# Patient Record
Sex: Female | Born: 1960 | Hispanic: Refuse to answer | Smoking: Former smoker
Health system: Southern US, Community
[De-identification: ages and names within clinical notes are randomized; demographics above are authoritative.]

## PROBLEM LIST (undated history)

## (undated) DIAGNOSIS — E785 Hyperlipidemia, unspecified: Secondary | ICD-10-CM

## (undated) DIAGNOSIS — Z8639 Personal history of other endocrine, nutritional and metabolic disease: Secondary | ICD-10-CM

## (undated) DIAGNOSIS — M069 Rheumatoid arthritis, unspecified: Secondary | ICD-10-CM

## (undated) DIAGNOSIS — G610 Guillain-Barre syndrome: Secondary | ICD-10-CM

## (undated) HISTORY — PX: TOTAL HIP ARTHROPLASTY: SHX124

## (undated) HISTORY — PX: TUBAL LIGATION: SHX77

## (undated) HISTORY — PX: APPENDECTOMY: SHX54

## (undated) HISTORY — DX: Personal history of other endocrine, nutritional and metabolic disease: Z86.39

## (undated) HISTORY — DX: Rheumatoid arthritis, unspecified: M06.9

## (undated) HISTORY — DX: Guillain-Barre syndrome: G61.0

## (undated) HISTORY — DX: Hyperlipidemia, unspecified: E78.5

---

## 2018-09-24 HISTORY — PX: REPLACEMENT TOTAL KNEE: SUR1224

## 2020-12-30 DIAGNOSIS — J449 Chronic obstructive pulmonary disease, unspecified: Secondary | ICD-10-CM | POA: Diagnosis not present

## 2020-12-30 DIAGNOSIS — F17210 Nicotine dependence, cigarettes, uncomplicated: Secondary | ICD-10-CM | POA: Diagnosis not present

## 2020-12-30 DIAGNOSIS — M069 Rheumatoid arthritis, unspecified: Secondary | ICD-10-CM | POA: Diagnosis not present

## 2020-12-30 DIAGNOSIS — E663 Overweight: Secondary | ICD-10-CM | POA: Diagnosis not present

## 2020-12-30 DIAGNOSIS — E0500 Thyrotoxicosis with diffuse goiter without thyrotoxic crisis or storm: Secondary | ICD-10-CM | POA: Diagnosis not present

## 2020-12-30 DIAGNOSIS — J309 Allergic rhinitis, unspecified: Secondary | ICD-10-CM | POA: Diagnosis not present

## 2020-12-30 DIAGNOSIS — M810 Age-related osteoporosis without current pathological fracture: Secondary | ICD-10-CM | POA: Diagnosis not present

## 2020-12-30 DIAGNOSIS — R229 Localized swelling, mass and lump, unspecified: Secondary | ICD-10-CM | POA: Diagnosis not present

## 2020-12-30 DIAGNOSIS — M159 Polyosteoarthritis, unspecified: Secondary | ICD-10-CM | POA: Diagnosis not present

## 2021-01-06 DIAGNOSIS — Z1159 Encounter for screening for other viral diseases: Secondary | ICD-10-CM | POA: Diagnosis not present

## 2021-01-06 DIAGNOSIS — Z79899 Other long term (current) drug therapy: Secondary | ICD-10-CM | POA: Diagnosis not present

## 2021-01-13 DIAGNOSIS — J309 Allergic rhinitis, unspecified: Secondary | ICD-10-CM | POA: Diagnosis not present

## 2021-01-13 DIAGNOSIS — M159 Polyosteoarthritis, unspecified: Secondary | ICD-10-CM | POA: Diagnosis not present

## 2021-01-13 DIAGNOSIS — M069 Rheumatoid arthritis, unspecified: Secondary | ICD-10-CM | POA: Diagnosis not present

## 2021-01-13 DIAGNOSIS — Z008 Encounter for other general examination: Secondary | ICD-10-CM | POA: Diagnosis not present

## 2021-01-13 DIAGNOSIS — E0500 Thyrotoxicosis with diffuse goiter without thyrotoxic crisis or storm: Secondary | ICD-10-CM | POA: Diagnosis not present

## 2021-01-13 DIAGNOSIS — E663 Overweight: Secondary | ICD-10-CM | POA: Diagnosis not present

## 2021-01-13 DIAGNOSIS — F17210 Nicotine dependence, cigarettes, uncomplicated: Secondary | ICD-10-CM | POA: Diagnosis not present

## 2021-01-13 DIAGNOSIS — D8481 Immunodeficiency due to conditions classified elsewhere: Secondary | ICD-10-CM | POA: Diagnosis not present

## 2021-01-13 DIAGNOSIS — J449 Chronic obstructive pulmonary disease, unspecified: Secondary | ICD-10-CM | POA: Diagnosis not present

## 2021-01-20 ENCOUNTER — Other Ambulatory Visit: Payer: Self-pay | Admitting: Family Medicine

## 2021-01-20 DIAGNOSIS — Z1231 Encounter for screening mammogram for malignant neoplasm of breast: Secondary | ICD-10-CM

## 2021-02-11 LAB — COLOGUARD: COLOGUARD: POSITIVE — AB

## 2021-02-22 NOTE — Progress Notes (Signed)
Office Visit Note  Patient: Leslie Brewer             Date of Birth: 11/16/1960           MRN: [id number]             PCP: Ellyn Hack, MD Referring: Ellyn Hack, MD Visit Date: 02/23/2021 Occupation: @  Subjective:  Pain in multiple joints.   History of Present Illness: Leslie Brewer is a 60 y.o. female in consultation per request of her PCP.  According to the patient her symptoms a started about 17 years ago after she fell and fractured her left ankle joint.  She states she was in a boot for sometimes and after that she started having pain and swelling in multiple joints.  She was seen by her rheumatologist and her rheumatoid factor was positive.  She was diagnosed with rheumatoid arthritis.  She recalls being on prednisone for a long time and gained a lot of weight.  She gradually tapered off prednisone.  She does not recall all the treatment she has taken in the past but she believes she was on some form of oral medication and some form of injectable medication before she started Enbrel.  She has been on Enbrel off and on for the last 6 years.  She was concerned about the side effects of Enbrel and took it only intermittently.  She states she moved from [place] to [place] in February 2022.  She had an Ro which lasted her until April 2022.  She has been off Enbrel since April 2022.  She has been experiencing pain and discomfort in her shoulders, elbows, wrist, hands, knees.  She has not seen visible swelling.  She has had bilateral total hip replacement and left knee replacement in the past.  There is no family history of rheumatoid arthritis or autoimmune disease.  He is gravida 3, para 1, abortions 2.  Activities of Daily Living:  Patient reports morning stiffness for 2  hours.   Patient Reports nocturnal pain.  Difficulty dressing/grooming: Reports Difficulty climbing stairs: Reports Difficulty getting out of chair: Reports Difficulty using hands for taps,  buttons, cutlery, and/or writing: Reports  Review of Systems  Constitutional: Positive for fatigue. Negative for night sweats, weight gain and weight loss.  HENT: Negative for mouth sores, trouble swallowing, trouble swallowing, mouth dryness and nose dryness.   Eyes: Negative for pain, redness, itching, visual disturbance and dryness.  Respiratory: Positive for shortness of breath. Negative for cough and difficulty breathing.        Related to anxiety and COPD  Cardiovascular: Negative for chest pain, palpitations, hypertension, irregular heartbeat and swelling in legs/feet.  Gastrointestinal: Positive for constipation. Negative for blood in stool and diarrhea.  Endocrine: Negative for increased urination.  Genitourinary: Negative for difficulty urinating and vaginal dryness.  Musculoskeletal: Positive for arthralgias, joint pain, joint swelling and morning stiffness. Negative for myalgias, muscle weakness, muscle tenderness and myalgias.  Skin: Positive for rash. Negative for color change, hair loss, skin tightness, ulcers and sensitivity to sunlight.       H/o eczema  Allergic/Immunologic: Negative for susceptible to infections.  Neurological: Positive for dizziness and numbness. Negative for headaches, memory loss, night sweats and weakness.  Hematological: Negative for bruising/bleeding tendency and swollen glands.  Psychiatric/Behavioral: Positive for depressed mood and sleep disturbance. Negative for confusion. The patient is nervous/anxious.     PMFS History:  Patient Active Problem List   Diagnosis Date  Noted  . Rheumatoid arthritis involving multiple sites with positive rheumatoid factor (HCC) 02/23/2021  . H/O bilateral hip replacements 02/23/2021  . History of total left knee replacement 02/23/2021    History reviewed. No pertinent past medical history.  Family History  Problem Relation Age of Onset  . Cancer Mother   . Healthy Daughter    Past Surgical History:   Procedure Laterality Date  . APPENDECTOMY    . REPLACEMENT TOTAL KNEE Left 2020  . TOTAL HIP ARTHROPLASTY Right   . TOTAL HIP ARTHROPLASTY Left   . TUBAL LIGATION     Social History   Social History Narrative  . Not on file    There is no immunization history on file for this patient.   Objective: Vital Signs: BP 138/83 (BP Location: Right Arm, Patient Position: Sitting, Cuff Size: Normal)   Pulse 89   Ht  (1.702 m)   Wt 164 lb 9.6 oz (74.7 kg)   BMI 25.78 kg/m    Physical Exam Vitals and nursing note reviewed.  Constitutional:      Appearance: She is well-developed.  HENT:     Head: Normocephalic and atraumatic.  Eyes:     Conjunctiva/sclera: Conjunctivae normal.  Cardiovascular:     Rate and Rhythm: Normal rate and regular rhythm.     Heart sounds: Normal heart sounds.  Pulmonary:     Effort: Pulmonary effort is normal.     Breath sounds: Normal breath sounds.  Abdominal:     General: Bowel sounds are normal.     Palpations: Abdomen is soft.  Musculoskeletal:     Cervical back: Normal range of motion.  Lymphadenopathy:     Cervical: No cervical adenopathy.  Skin:    General: Skin is warm and dry.     Capillary Refill: Capillary refill takes less than 2 seconds.  Neurological:     Mental Status: She is alert and oriented to person, place, and time.  Psychiatric:        Behavior: Behavior normal.      Musculoskeletal Exam: C-spine was in good range of motion.  There was no tenderness over thoracic or lumbar spine.  Shoulder joints with good range of motion.  She has some limitation of extension of her right elbow joint without any synovitis.  Left elbow joint was in good range of motion.  She has limited extension of bilateral wrist joints.  Mild synovitis was noted in the left wrist joint and left second MCP joint.  She has limited range of motion of bilateral hip joints which are replaced.  She had discomfort in her left knee joint which was replaced.   She also had discomfort range of motion of her right knee joint without any warmth swelling or effusion.  She has warmth on palpation of her ankle joints.  She has bilateral pes planus.  No synovitis was noted in MTPs or PIPs.  CDAI Exam: CDAI Score: 4.8  Patient Global: 4 mm; Provider Global: 4 mm Swollen: 2 ; Tender: 2  Joint Exam 02/23/2021      Right  Left  Wrist     Swollen Tender  MCP 2     Swollen Tender     Investigation: No additional findings.  Imaging: XR Foot 2 Views Left  Result Date: 02/23/2021 Juxta-articular osteopenia was noted.  First MTP narrowing and subluxation was noted.  PIP and DIP narrowing was noted.  Intertarsal narrowing and dorsal spurring was noted.  Subtalar narrowing was noted.  No erosive changes were noted. Impression: These findings are consistent with osteoarthritis and rheumatoid arthritis overlap.  XR Foot 2 Views Right  Result Date: 02/23/2021 Juxta-articular osteopenia was noted.  First MTP narrowing and subluxation was noted.  PIP and DIP narrowing was noted.  Intertarsal narrowing and dorsal spurring was noted.  Subtalar narrowing was noted.  No erosive changes were noted. Impression: These findings are consistent with osteoarthritis and rheumatoid arthritis overlap.  XR Hand 2 View Left  Result Date: 02/23/2021 Juxta-articular osteopenia was noted.  Narrowing of all MCP and PIP joints was noted.  DIP narrowing was noted.  Severe intercarpal and radiocarpal joint space narrowing was noted.  Possible cystic versus erosive changes were noted in the carpal bones and also in the ulna. Impression: These findings are consistent with rheumatoid arthritis and osteoarthritis overlap.  XR Hand 2 View Right  Result Date: 02/23/2021 Juxta-articular osteopenia was noted.  Narrowing of all MCP joints was noted.  PIP and DIP narrowing was noted.  Intercarpal and radiocarpal joint space narrowing was noted.  No erosive changes were noted. Impression: These  findings are consistent with rheumatoid arthritis and osteoarthritis overlap.  XR KNEE 3 VIEW RIGHT  Result Date: 02/23/2021 Moderate medial compartment narrowing with intercondylar osteophytes was noted.  Moderate patellofemoral narrowing was noted.  No chondrocalcinosis was noted. Impression: These findings are consistent moderate osteoarthritis and moderate chondromalacia patella.   Recent Labs: No results found for: WBC, HGB, PLT, NA, K, CL, CO2, GLUCOSE, BUN, CREATININE, BILITOT, ALKPHOS, AST, ALT, PROT, ALBUMIN, CALCIUM, GFRAA, QFTBGOLD, QFTBGOLDPLUS  Speciality Comments: No specialty comments available.  Procedures:  No procedures performed Allergies: Latex   Assessment / Plan:     Visit Diagnoses: Rheumatoid arthritis involving multiple sites with positive rheumatoid factor (HCC) - Dx by rheumatologist in [place] -according the patient she was diagnosed with rheumatoid arthritis 17years.  She was initially treated with prednisone for many years.  She states she gained a lot of weight and tapered prednisone.  She was tried on some oral medications which did not work.  She also was on some injectable medication but she does not recall the name.  She was started on Enbrel about 6 years ago.  She states she was concerned about the side effects of Enbrel and whenever she felt better she will come off Enbrel.  She had taken Enbrel in intermittently for the last 6 years.  She moved from [place] to [place] in February.  She states that the Enbrel lasted her until April 2022.  She had been off Enbrel since then.  She has been experiencing some discomfort in her wrist and her hands.  She has intermittent discomfort in her feet.  Her right knee joint continues to hurt.  Her bilateral hip joints and left knee joint are replaced which also causes chronic pain.  We had detailed discussion regarding different treatment options and their side effects.  She wants to continue with Enbrel.  Handout was given and  consent was taken.  We will obtain labs and x-rays today.  Once the labs are available then we can start her on Enbrel.  Plan: Sedimentation rate, Rheumatoid factor, Cyclic citrul peptide antibody, IgG, ANA  High risk medication use - Enbrel 50 mg subcu weekly.  Her last dose was in April 2022 per patient.- Plan: DG Chest 2 View, CBC with Differential/Platelet, COMPLETE METABOLIC PANEL WITH GFR, Hepatitis B core antibody, IgM, Hepatitis B surface antigen, Hepatitis C antibody, QuantiFERON-TB Gold Plus, Serum protein electrophoresis with  reflex, IgG, IgA, IgM, HIV Antibody (routine testing w rflx) patient was advised to stop Enbrel in case she develops an infection and resume once the infection resolves.  Instructions placed in the AVS.  Instructions regarding the immunization were placed in the AVS.  Yearly skin examination was also advised to screen for nonmelanoma skin cancer.  Once she starts Enbrel she will need labs in a month and then every 3 months to monitor for drug toxicity.  Pain in both hands -she complains of discomfort in her bilateral hands.  Mild synovitis was noted in the left wrist joint and left second MCP joint.  Plan: XR Hand 2 View Right, XR Hand 2 View Left.  X-rays were consistent with severe rheumatoid arthritis and osteoarthritis overlap with erosive changes in the carpal bones.  H/O bilateral hip replacements-she had limited range of motion in bilateral hip joints with no discomfort.  Chronic pain of right knee -she had painful range of motion of her right knee joint but no warmth swelling or effusion was noted.  Plan: XR KNEE 3 VIEW RIGHT.  Moderate osteoarthritis and moderate chondromalacia patella was noted.  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  She continues to have discomfort in the left knee joint.  Pain in both feet -she had left ankle joint fracture in the past.  She had some warmth on palpation of her ankle joints.  She gives history of discomfort  in her feet without and swelling.  Plan: XR Foot 2 Views Right, XR Foot 2 Views Left.  X-rays were consistent with osteoarthritis and rheumatoid arthritis overlap.  Other fatigue -she has history of fatigue.  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  She states she was under care of an ophthalmologist in [place].  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  She was followed by a neurologist in [place].  She has been using a cane since she had been very.  History of COPD-she gets shortness of breath on exertion.  Smoker - 1PPD x 35 years.  She will need a low radiation CT to screen for cancer.  Have advised her to discuss this further with her PCP.  Smoking cessation was discussed.  Increased risk of rheumatoid arthritis with smoking was also discussed.  Orders: Orders Placed This Encounter  Procedures  . XR Hand 2 View Right  . XR Hand 2 View Left  . XR Foot 2 Views Right  . XR Foot 2 Views Left  . DG Chest 2 View  . XR KNEE 3 VIEW RIGHT  . CBC with Differential/Platelet  . COMPLETE METABOLIC PANEL WITH GFR  . Sedimentation rate  . CK  . TSH  . Rheumatoid factor  . Cyclic citrul peptide antibody, IgG  . Hepatitis B core antibody, IgM  . Hepatitis B surface antigen  . Hepatitis C antibody  . QuantiFERON-TB Gold Plus  . Serum protein electrophoresis with reflex  . IgG, IgA, IgM  . HIV Antibody (routine testing w rflx)  . ANA  . Ambulatory referral to Dermatology   No orders of the defined types were placed in this encounter.     Follow-Up Instructions: Return for Rheumatoid arthritis.   Pollyann Savoy, MD  Note - This record has been created using Animal nutritionist.  Chart creation errors have been sought, but may not always  have been located. Such creation errors do not reflect on  the standard of medical care.

## 2021-02-23 ENCOUNTER — Ambulatory Visit: Payer: Self-pay

## 2021-02-23 ENCOUNTER — Ambulatory Visit (INDEPENDENT_AMBULATORY_CARE_PROVIDER_SITE_OTHER): Payer: Medicare PPO | Admitting: Rheumatology

## 2021-02-23 ENCOUNTER — Ambulatory Visit (HOSPITAL_COMMUNITY)
Admission: RE | Admit: 2021-02-23 | Discharge: 2021-02-23 | Disposition: A | Discharge status: Home / Self Care | Payer: Medicare PPO | Source: Ambulatory Visit | Attending: Rheumatology | Admitting: Rheumatology

## 2021-02-23 ENCOUNTER — Encounter (INDEPENDENT_AMBULATORY_CARE_PROVIDER_SITE_OTHER): Payer: Self-pay

## 2021-02-23 ENCOUNTER — Encounter: Payer: Self-pay | Admitting: Rheumatology

## 2021-02-23 ENCOUNTER — Telehealth: Payer: Self-pay | Admitting: Pharmacist

## 2021-02-23 ENCOUNTER — Other Ambulatory Visit (HOSPITAL_COMMUNITY): Payer: Self-pay

## 2021-02-23 ENCOUNTER — Other Ambulatory Visit: Payer: Self-pay

## 2021-02-23 VITALS — BP 138/83 | HR 89 | Ht 67.0 in | Wt 164.6 lb

## 2021-02-23 DIAGNOSIS — M79671 Pain in right foot: Secondary | ICD-10-CM | POA: Diagnosis not present

## 2021-02-23 DIAGNOSIS — Z79899 Other long term (current) drug therapy: Secondary | ICD-10-CM

## 2021-02-23 DIAGNOSIS — F17200 Nicotine dependence, unspecified, uncomplicated: Secondary | ICD-10-CM

## 2021-02-23 DIAGNOSIS — M0579 Rheumatoid arthritis with rheumatoid factor of multiple sites without organ or systems involvement: Secondary | ICD-10-CM | POA: Diagnosis not present

## 2021-02-23 DIAGNOSIS — M25561 Pain in right knee: Secondary | ICD-10-CM | POA: Diagnosis not present

## 2021-02-23 DIAGNOSIS — E0500 Thyrotoxicosis with diffuse goiter without thyrotoxic crisis or storm: Secondary | ICD-10-CM

## 2021-02-23 DIAGNOSIS — Z96643 Presence of artificial hip joint, bilateral: Secondary | ICD-10-CM | POA: Diagnosis not present

## 2021-02-23 DIAGNOSIS — M79672 Pain in left foot: Secondary | ICD-10-CM | POA: Diagnosis not present

## 2021-02-23 DIAGNOSIS — M79641 Pain in right hand: Secondary | ICD-10-CM

## 2021-02-23 DIAGNOSIS — F17210 Nicotine dependence, cigarettes, uncomplicated: Secondary | ICD-10-CM

## 2021-02-23 DIAGNOSIS — Z8669 Personal history of other diseases of the nervous system and sense organs: Secondary | ICD-10-CM

## 2021-02-23 DIAGNOSIS — Z96652 Presence of left artificial knee joint: Secondary | ICD-10-CM | POA: Insufficient documentation

## 2021-02-23 DIAGNOSIS — R5383 Other fatigue: Secondary | ICD-10-CM

## 2021-02-23 DIAGNOSIS — M79642 Pain in left hand: Secondary | ICD-10-CM

## 2021-02-23 DIAGNOSIS — G8929 Other chronic pain: Secondary | ICD-10-CM

## 2021-02-23 DIAGNOSIS — Z8709 Personal history of other diseases of the respiratory system: Secondary | ICD-10-CM

## 2021-02-23 NOTE — Telephone Encounter (Signed)
Enbrel PAP submission is pending all baseline labs and chest x-ray that were ordered/collected today at [facility] 02/23/21. Will fax completed application once all baseline labs completed  Chesley Mires, PharmD, MPH Clinical Pharmacist (Rheumatology and Pulmonology)

## 2021-02-23 NOTE — Patient Instructions (Signed)
Etanercept Injection What is this medicine? ETANERCEPT (et a NER sept) is used for the treatment of rheumatoid arthritis. The medicine is also used to treat psoriatic arthritis, ankylosing spondylitis, and psoriasis. This medicine may be used for other purposes; ask your health care provider or pharmacist if you have questions. COMMON BRAND NAME(S): Enbrel What should I tell my health care provider before I take this medicine? They need to know if you have any of these conditions:  bleeding disorder  cancer  diabetes  granulomatosis with polyangiitis  heart failure  HIV or AIDs  immune system problems  infection such as tuberculosis (TB) or other bacterial, fungal or viral infections  liver disease  nervous system problems such as Guillain-Barre syndrome, multiple sclerosis or seizures  recent or upcoming vaccine  an unusual or allergic reaction to etanercept, other medicines, latex, rubber, food, dyes, or preservatives  pregnant or trying to get pregnant  breast-feeding How should I use this medicine? The medicine is injected under the skin. You will be taught how to prepare and give it. Take it as directed on the prescription label. Keep taking it unless your health care provider tells you stop. This medicine comes with INSTRUCTIONS FOR USE. Ask your pharmacist for directions on how to use this medicine. Read the information carefully. Talk to your pharmacist or health care provider if you have questions. If you use a pen, be sure to take off the outer needle cover before using the dose. It is important that you put your used needles and syringes in a special sharps container. Do not put them in a trash can. If you do not have a sharps container, call your pharmacist or health care provider to get one. A special MedGuide will be given to you by the pharmacist with each prescription and refill. Be sure to read this information carefully each time. Talk to your health care  provider about the use of this medicine in children. While it may be prescribed for children as young as 2 years of age for selected conditions, precautions do apply. Overdosage: If you think you have taken too much of this medicine contact a poison control center or emergency room at once. NOTE: This medicine is only for you. Do not share this medicine with others. What if I miss a dose? If you miss a dose, take it as soon as you can. If it is almost time for your next dose, take only that dose. Do not take double or extra doses. What may interact with this medicine? Do not take this medicine with any of the following medications:  biologic medicines such as adalimumab, certolizumab, golimumab, infliximab  live vaccines  rilonacept This medicine may also interact with the following medications:  abatacept  anakinra  biologic medicines such as anifrolumab, baricitinib, belimumab, canakinumab, natalizumab, rituximab, sarilumab, tocilizumab, tofacitinib, upadacitinib, vedolizumab  cyclophosphamide  sulfasalazine This list may not describe all possible interactions. Give your health care provider a list of all the medicines, herbs, non-prescription drugs, or dietary supplements you use. Also tell them if you smoke, drink alcohol, or use illegal drugs. Some items may interact with your medicine. What should I watch for while using this medicine? Visit your health care provider for regular checks on your progress. Tell your health care provider if your symptoms do not start to get better or if they get worse. This medicine may increase your risk of getting an infection. Call your health care provider for advice if you get a fever,   chills, sore throat, or other symptoms of a cold or flu. Do not treat yourself. Try to avoid being around people who are sick. If you have not had the measles or chickenpox vaccines, tell your health care provider right away if you are around someone with these  viruses. You will be tested for tuberculosis (TB) before you start this medicine. If your doctor prescribes any medicine for TB, you should start taking the TB medicine before starting this medicine. Make sure to finish the full course of TB medicine. Avoid taking medicines that contain aspirin, acetaminophen, ibuprofen, naproxen, or ketoprofen unless instructed by your health care provider. These medicines may hide fever. Talk to your health care provider about your risk of cancer. You may be more at risk for certain types of cancer if you take this medicine. This medicine can decrease the response to a vaccine. If you need to get vaccinated, tell your health care provider if you have received this medicine. Extra booster doses may be needed. Talk to your health care provider to see if a different vaccination schedule is needed. What side effects may I notice from receiving this medicine? Side effects that you should report to your health care provider as soon as possible:  allergic reactions (skin rash, itching or hives; swelling of the face, lips, or tongue)  changes in vision  dizziness  heart failure (trouble breathing; fast, irregular heartbeat; sudden weight gain; swelling of the ankles, feet, hands; unusually weak or tired)  infection (fever, chills, cough, sore throat, pain or trouble passing urine)  light-colored stools  liver injury (dark yellow or brown urine; general ill feeling or flu-like symptoms; loss of appetite, right upper belly pain; unusually weak or tired, yellowing of the eyes or skin)  low red blood cell counts (trouble breathing; feeling faint; lightheaded, falls; unusually weak or tired)  pain, tingling, numbness in the hands or feet  red, scaly patches or raised bumps on the skin  seizures  unusual bruising or bleeding  weakness in the arms or legs Side effects that usually do not require medical attention (report to your health care provider if they  continue or are bothersome):  headache  nausea  pain, redness, or irritation at site where injected  vomiting This list may not describe all possible side effects. Call your doctor for medical advice about side effects. You may report side effects to FDA at 1-800-FDA-1088. Where should I keep my medicine? Keep out of the reach of children and pets. See product for storage information. Each product may have different instructions. Get rid of any unused medicine after the expiration date. To get rid of medicines that are no longer needed or have expired:  Take the medicine to a medicine take-back program. Check with your pharmacy or law enforcement to find a location.  If you cannot return the medicine, ask your pharmacist or health care provider how to get rid of this medicine safely. NOTE: This sheet is a summary. It may not cover all possible information. If you have questions about this medicine, talk to your doctor, pharmacist, or health care provider.  2021 Elsevier/Gold Standard (2020-06-24 12:59:29)  

## 2021-02-23 NOTE — Progress Notes (Signed)
Pharmacy Note  Subjective: Patient presents today to the [facility] for initial office visit with Dr. Corliss Skains.  Patient seen by the pharmacist for counseling on Enbrel for rheumatoid arthritis.  She was spacing Enbrel dosing since January 2022 since she did not reapply for [employer] patient assistance. Last Enbrel dose was in April 2022.  Diagnosis of heart failure: No  Objective: Baseline labs drawn today.   Chest x-ray: ordered today - patient provided with directions  Assessment/Plan:  Counseled patient that Enbrel is a TNF blocking agent.  Counseled patient on purpose, proper use, and adverse effects of Enbrel.  Reviewed the most common adverse effects including infections, headache, and injection site reactions.  Discussed that there is the possibility of an increased risk of malignancy including non-melanoma skin cancer but it is not well understood if this increased risk is due to the medication or the disease state.  Advised patient to get yearly dermatology exams due to risk of skin cancer.  Counseled patient that Enbrel should be held prior to scheduled surgery.  Counseled patient to avoid live vaccines while on Enbrel.   She has history of GBS so does not receive influenza vaccine or pneumonia vaccine.  She is eligible for zoster vaccine (2-dose inactivated series)  She has received COVID19 vaccine and booster per patient and referral notes however she does not have COVID19 vaccine card today. Recommended she bring it to f/u appointment.   She states she has colonoscopy that is due but has not yet completed pre-screening to have this scheduled. Briefly reviewed holding Enbrel the week of colonoscopy and resume at next scheduled dose after if there are no signs or symptoms of infection  Reviewed the importance of regular labs while on Enbrel therapy. All baseline immunosuppressive labs drawn today.  Will monitor CBC and CMP 1 month after starting and then every 3 months  routinely thereafter. Will monitor TB gold annually. Standing orders placed. Provided patient with medication education material and answered all questions.  Patient consented to Enbrel.  Will upload consent into the media tab.  Reviewed storage instructions for Enbrel.   She received Enbrel through [employer] patient assistance. She did not renew her enrollment into the application. Completed paperwork for patient assistance today. Will start Enbrl BIV.  Dermatology referral was placed today.  Chesley Mires, PharmD, MPH Clinical Pharmacist (Rheumatology and Pulmonology)

## 2021-02-23 NOTE — Progress Notes (Signed)
Chest xray is normal

## 2021-02-23 NOTE — Telephone Encounter (Signed)
Please start Enbrel Sureclick auto-injector BIV.  Dose: 50mg  SQ every 7 days  Dx: M05.79 (rheumatoid arthritis)  Previously tried therapies: . Enbrel - stopped d/t access but was taking for 6 years. Last dose was in April 2022.  She has [facility] Medicare  Completed patient portion of [employer] PAP at her [facility] today, 02/23/21, awaiting provider signature. Will submit once prior auth through insurance is process  04/25/21, PharmD, MPH Clinical Pharmacist (Rheumatology and Pulmonology)

## 2021-02-23 NOTE — Telephone Encounter (Addendum)
Received notification from [facility] regarding a prior authorization for ENBREL. Authorization has been APPROVED from 09/24/2020 to 09/23/2021.  Key: [id number] Authorization # [id number] Phone # [phone]  Test claim reveals that insurance covers [phone], leaving pt with a $2,048.03 copay. $607.59 attributed towards coverage gap.  Routing back to [facility] for PAP submission.

## 2021-02-27 ENCOUNTER — Telehealth: Payer: Self-pay

## 2021-02-27 LAB — COMPLETE METABOLIC PANEL WITH GFR
AG Ratio: 2 (calc) (ref 1.0–2.5)
ALT: 18 U/L (ref 6–29)
AST: 22 U/L (ref 10–35)
Albumin: 4.7 g/dL (ref 3.6–5.1)
Alkaline phosphatase (APISO): 112 U/L (ref 37–153)
BUN/Creatinine Ratio: 5 (calc) — ABNORMAL LOW (ref 6–22)
BUN: 4 mg/dL — ABNORMAL LOW (ref 7–25)
CO2: 23 mmol/L (ref 20–32)
Calcium: 9.9 mg/dL (ref 8.6–10.4)
Chloride: 105 mmol/L (ref 98–110)
Creat: 0.74 mg/dL (ref 0.50–0.99)
GFR, Est African American: 101 mL/min/{1.73_m2} (ref >=60)
GFR, Est Non African American: 87 mL/min/{1.73_m2} (ref >=60)
Globulin: 2.4 g/dL (calc) (ref 1.9–3.7)
Glucose, Bld: 75 mg/dL (ref 65–99)
Potassium: 4.6 mmol/L (ref 3.5–5.3)
Sodium: 143 mmol/L (ref 135–146)
Total Bilirubin: 0.6 mg/dL (ref 0.2–1.2)
Total Protein: 7.1 g/dL (ref 6.1–8.1)

## 2021-02-27 LAB — CBC WITH DIFFERENTIAL/PLATELET
Absolute Monocytes: 806 cells/uL (ref 200–950)
Basophils Absolute: 67 cells/uL (ref 0–200)
Basophils Relative: 0.7 %
Eosinophils Absolute: 86 cells/uL (ref 15–500)
Eosinophils Relative: 0.9 %
HCT: 51.3 % — ABNORMAL HIGH (ref 35.0–45.0)
Hemoglobin: 16.3 g/dL — ABNORMAL HIGH (ref 11.7–15.5)
Lymphs Abs: 2717 cells/uL (ref 850–3900)
MCH: 30 pg (ref 27.0–33.0)
MCHC: 31.8 g/dL — ABNORMAL LOW (ref 32.0–36.0)
MCV: 94.3 fL (ref 80.0–100.0)
MPV: 11 fL (ref 7.5–12.5)
Monocytes Relative: 8.4 %
Neutro Abs: 5923 cells/uL (ref 1500–7800)
Neutrophils Relative %: 61.7 %
Platelets: 277 10*3/uL (ref 140–400)
RBC: 5.44 10*6/uL — ABNORMAL HIGH (ref 3.80–5.10)
RDW: 14.9 % (ref 11.0–15.0)
Total Lymphocyte: 28.3 %
WBC: 9.6 10*3/uL (ref 3.8–10.8)

## 2021-02-27 LAB — IGG, IGA, IGM
IgG (Immunoglobin G), Serum: 909 mg/dL (ref 600–1540)
IgM, Serum: 154 mg/dL (ref 50–300)
Immunoglobulin A: 156 mg/dL (ref 70–320)

## 2021-02-27 LAB — QUANTIFERON-TB GOLD PLUS
Mitogen-NIL: 5.34 IU/mL
NIL: 0.03 IU/mL
QuantiFERON-TB Gold Plus: NEGATIVE
TB1-NIL: <0 IU/mL
TB2-NIL: <0 IU/mL

## 2021-02-27 LAB — SEDIMENTATION RATE: Sed Rate: 2 mm/h (ref 0–30)

## 2021-02-27 LAB — HEPATITIS B SURFACE ANTIGEN: Hepatitis B Surface Ag: NONREACTIVE

## 2021-02-27 LAB — HEPATITIS B CORE ANTIBODY, IGM: Hep B C IgM: NONREACTIVE

## 2021-02-27 LAB — PROTEIN ELECTROPHORESIS, SERUM, WITH REFLEX
Albumin ELP: 4.5 g/dL (ref 3.8–4.8)
Alpha 1: 0.3 g/dL (ref 0.2–0.3)
Alpha 2: 0.7 g/dL (ref 0.5–0.9)
Beta 2: 0.3 g/dL (ref 0.2–0.5)
Beta Globulin: 0.4 g/dL (ref 0.4–0.6)
Gamma Globulin: 0.9 g/dL (ref 0.8–1.7)
Total Protein: 7.1 g/dL (ref 6.1–8.1)

## 2021-02-27 LAB — RHEUMATOID FACTOR: Rheumatoid fact SerPl-aCnc: <14 IU/mL (ref <14)

## 2021-02-27 LAB — HEPATITIS C ANTIBODY
Hepatitis C Ab: NONREACTIVE
SIGNAL TO CUT-OFF: 0.02 (ref <1.00)

## 2021-02-27 LAB — HIV ANTIBODY (ROUTINE TESTING W REFLEX): HIV 1&2 Ab, 4th Generation: NONREACTIVE

## 2021-02-27 LAB — ANA: Anti Nuclear Antibody (ANA): NEGATIVE

## 2021-02-27 LAB — CK: Total CK: 155 U/L — ABNORMAL HIGH (ref 29–143)

## 2021-02-27 LAB — TSH: TSH: 0.81 mIU/L (ref 0.40–4.50)

## 2021-02-27 LAB — CYCLIC CITRUL PEPTIDE ANTIBODY, IGG: Cyclic Citrullin Peptide Ab: <16 UNITS

## 2021-02-27 NOTE — Progress Notes (Signed)
CK is mildly elevated.  I will repeat CK at the follow-up visit.  All other labs are within normal limits.

## 2021-02-27 NOTE — Telephone Encounter (Signed)
Alexis from [employer] left a voicemail stating they need a prior authorization for patient's Enbrel medication.    Phone #[phone] Fax #[phone]

## 2021-02-27 NOTE — Telephone Encounter (Signed)
Submitted Patient Assistance Application to [employer] for [employer] along with provider portion, PA, signed patient portion, med list, insurance card copy, and income documents. Will update patient when we receive a response.  Fax# [phone] Phone# [phone]  Chesley Mires, PharmD, MPH Clinical Pharmacist (Rheumatology and Pulmonology)

## 2021-02-27 NOTE — Telephone Encounter (Signed)
Enbrel PAP application was submitted with copy of auth approval letter. Re-faxed application to [employer]. F/u will occur in Enbrel BIV encounter

## 2021-02-28 ENCOUNTER — Other Ambulatory Visit: Payer: Self-pay | Admitting: *Deleted

## 2021-02-28 DIAGNOSIS — Z79899 Other long term (current) drug therapy: Secondary | ICD-10-CM

## 2021-02-28 DIAGNOSIS — M0579 Rheumatoid arthritis with rheumatoid factor of multiple sites without organ or systems involvement: Secondary | ICD-10-CM

## 2021-03-02 NOTE — Telephone Encounter (Signed)
Reached out to [employer] for a f/u, application is still being processed. Verified fax number on file, process should take another 1-2 business days. Will mark for f/u on Monday.

## 2021-03-06 NOTE — Telephone Encounter (Signed)
Called [employer] to check on status of Enbrel [employer] patient assistance application  Requested prior auth letter be faxed to [employer] to proceed with application. Faxed prior auth approval letter to [employer] today Fax: [phone]  Chesley Mires, PharmD, MPH Clinical Pharmacist (Rheumatology and Pulmonology)

## 2021-03-09 NOTE — Telephone Encounter (Signed)
Received a fax from  [employer] regarding an approval for ENBREL patient assistance from 03/08/2021 to 09/23/2021.   Phone number: [phone]

## 2021-03-09 NOTE — Telephone Encounter (Signed)
Called patient to notify of [employer] PAP approval for Enbrel. She states she just spoke with [employer] and expected shipment of Enbrel is 03/14/21. She expressed gratitude. Last dose was in April 2022 and she does not need a new start visit to resume. All baseline labs in place to proceed with re-starting Enbrel.  Nothing further needed.   Chesley Mires, PharmD, MPH Clinical Pharmacist (Rheumatology and Pulmonology)

## 2021-03-14 ENCOUNTER — Ambulatory Visit: Payer: Medicare PPO | Admitting: Rheumatology

## 2021-04-03 NOTE — Progress Notes (Signed)
Office Visit Note  Patient: Leslie Brewer             Date of Birth: 1961/07/06           MRN: [id number]             PCP: Roselee Nova, MD Referring: Roselee Nova, MD Visit Date: 04/13/2021 Occupation: _0 @  Subjective:  Medication management   History of Present Illness: Camia Dipinto is a 60 y.o. 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  She states she started Enbrel about 5 weeks ago which she has been tolerating well.  She has noticed improvement in her symptoms since she has been taking Enbrel.  She continues to have discomfort in her both hands, both knees and her feet.  She have a rash on her back which has been getting worse.  She states she has burning sensation from the rash.  She tried some antibacterial shampoo which helped to some extent.  She has an appointment with the dermatologist in December.  Activities of Daily Living:  Patient reports morning stiffness for 2 hours.   Patient Reports nocturnal pain.  Difficulty dressing/grooming: Reports Difficulty climbing stairs: Reports Difficulty getting out of chair: Reports Difficulty using hands for taps, buttons, cutlery, and/or writing: Reports  Review of Systems  Constitutional:  Negative for fatigue.  HENT:  Negative for mouth sores, mouth dryness and nose dryness.   Eyes:  Negative for pain, itching and dryness.  Respiratory:  Negative for shortness of breath and difficulty breathing.   Cardiovascular:  Negative for chest pain and palpitations.  Gastrointestinal:  Positive for constipation. Negative for blood in stool and diarrhea.  Endocrine: Negative for increased urination.  Genitourinary:  Negative for difficulty urinating.  Musculoskeletal:  Positive for joint pain, joint pain, joint swelling and morning stiffness. Negative for myalgias, muscle tenderness and myalgias.  Skin:  Positive for rash. Negative for color change.  Allergic/Immunologic:  Negative for susceptible to infections.  Neurological:  Positive for dizziness and numbness. Negative for headaches and memory loss.  Hematological:  Negative for bruising/bleeding tendency.  Psychiatric/Behavioral:  Positive for sleep disturbance. Negative for confusion.    PMFS History:  Patient Active Problem List   Diagnosis Date Noted   Rheumatoid arthritis involving multiple sites with positive rheumatoid factor ([place]) 02/23/2021   H/O bilateral hip replacements 02/23/2021   History of total left knee replacement 02/23/2021    History reviewed. No pertinent past medical history.  Family History  Problem Relation Age of Onset   Cancer Mother    Healthy Daughter    Past Surgical History:  Procedure Laterality Date   APPENDECTOMY     REPLACEMENT TOTAL KNEE Left 2020   TOTAL HIP ARTHROPLASTY Right    TOTAL HIP ARTHROPLASTY Left    TUBAL LIGATION     Social History   Social History Narrative   Not on file   Immunization History  Administered Date(s) Administered   Moderna Sars-Covid-2 Vaccination 01/01/2020, 01/29/2020, 08/29/2020     Objective: Vital Signs: BP 126/85 (BP Location: Left Arm, Patient Position: Sitting, Cuff Size: Normal)   Pulse 79   Ht _1  (1.702 m)   Wt 163 lb 6.4 oz (74.1 kg)   BMI 25.59 kg/m    Physical Exam Vitals and nursing note reviewed.  Constitutional:      Appearance: She is well-developed.  HENT:     Head: Normocephalic and atraumatic.  Eyes:  Conjunctiva/sclera: Conjunctivae normal.  Cardiovascular:     Rate and Rhythm: Normal rate and regular rhythm.     Heart sounds: Normal heart sounds.  Pulmonary:     Effort: Pulmonary effort is normal.     Breath sounds: Normal breath sounds.  Abdominal:     General: Bowel sounds are normal.     Palpations: Abdomen is soft.  Musculoskeletal:     Cervical back: Normal range of motion.  Lymphadenopathy:     Cervical: No cervical adenopathy.  Skin:    General: Skin is warm and dry.      Capillary Refill: Capillary refill takes less than 2 seconds.  Neurological:     Mental Status: She is alert and oriented to person, place, and time.  Psychiatric:        Behavior: Behavior normal.     Musculoskeletal Exam: C-spine was in good range of motion.  Shoulder joints were in good range of motion.  She has contractures in her elbows without synovitis.  She is synovial thickening over wrist joints with limited range of motion.  Synovial thickening over MCPs was noted with no active synovitis.  She had no tenderness over MCPs or PIPs.  Both hip joints are replaced.  Right hip joint was in good range of motion.  Left hip joint had limited range of motion.  Left knee joint is replaced with limited extension.  Right knee joint was in good range of motion.  There is no tenderness over ankles or MTPs.  CDAI Exam: CDAI Score: 0.9  Patient Global: 6 mm; Provider Global: 3 mm Swollen: 0 ; Tender: 0  Joint Exam 04/13/2021   No joint exam has been documented for this visit   There is currently no information documented on the homunculus. Go to the Rheumatology activity and complete the homunculus joint exam.  Investigation: No additional findings.  Imaging: No results found.  Recent Labs: Lab Results  Component Value Date   WBC 9.6 02/23/2021   HGB 16.3 (H) 02/23/2021   PLT 277 02/23/2021   NA 143 02/23/2021   K 4.6 02/23/2021   CL 105 02/23/2021   CO2 23 02/23/2021   GLUCOSE 75 02/23/2021   BUN 4 (L) 02/23/2021   CREATININE 0.74 02/23/2021   BILITOT 0.6 02/23/2021   AST 22 02/23/2021   ALT 18 02/23/2021   PROT 7.1 02/23/2021   PROT 7.1 02/23/2021   CALCIUM 9.9 02/23/2021   GFRAA 101 02/23/2021   QFTBGOLDPLUS NEGATIVE 02/23/2021   February 23, 2021 SPEP normal, immunoglobulins normal, TB Gold negative, hepatitis B-, hepatitis C negative, HIV negative, ESR 2, TSH normal, RF negative, anti-CCP negative, ANA negative, CK155   Speciality Comments: dxd in [place] 17 years ago.   Treated with prednisone many years, Enbrel for 6 years off and on  Procedures:  No procedures performed Allergies: Latex   Assessment / Plan:     Visit Diagnoses: Rheumatoid arthritis of multiple sites with negative rheumatoid factor (HCC) - RF negative, anti-CCP negative, positive ANA. Dxd in [place].  Treated with prednisone for many years and then Enbrel off and on for the last 6 years.  Patient is started Enbrel about 5 weeks ago.  She had a good response.  She states she has noticed improvement in the pain and swelling.  Although she still have difficulty walking due to her left replaced knee.  High risk medication use -  Enbrel 50 mg subcu weekly.  Her last dose was in April 2022 per patient. -  Plan: CBC with Differential/Platelet, COMPLETE METABOLIC PANEL WITH GFR today and then every 3 months to monitor for drug toxicity.  She was advised to stop Enbrel in case she develops an infection and resume once the infection resolves.  Recommendation regarding other vaccines were placed in the AVS.  Elevated CK -her CK was mildly elevated.  She had no muscular weakness.  I will check labs again today.  Plan: CK, Aldolase  Pain in both hands - X-ray findings are consistent with severe erosive rheumatoid arthritis and osteoarthritis overlap  H/O bilateral hip replacements-she has limited extension of the left hip joint.  Primary osteoarthritis of right knee -moderate osteoarthritis and moderate chondromalacia patella.  History of total left knee replacement-she has limited extension of her knee joint and has difficulty walking because of that.  Pain in both feet - Osteoarthritis and severe erosive rheumatoid arthritis was noted on the x-rays.  Graves' ophthalmopathy  H/O Guillain-Barre syndrome - She was hospitalized in 2008 in [place].  She still have residual tongue numbness, difficulty talking and dizziness.  She uses a cane for stability.  History of COPD  Smoker - 1 pack/day for 35  years.  Increased risk of heart disease with the rheumatoid arthritis was discussed.  Instructions regarding diet and exercise was also placed in the AVS.  Rash-patient complains of rash and pruritus on her back.  She has been using antibacterial shampoo which has been helpful.  I did not notice any rash today.  She has several blackheads.  I advised her to continue using antibacterial shampoo.  Orders: Orders Placed This Encounter  Procedures   CBC with Differential/Platelet   COMPLETE METABOLIC PANEL WITH GFR   CK   Aldolase    No orders of the defined types were placed in this encounter.    Follow-Up Instructions: Return in about 3 months (around 07/14/2021) for Rheumatoid arthritis.   Bo Merino, MD  Note - This record has been created using Editor, commissioning.  Chart creation errors have been sought, but may not always  have been located. Such creation errors do not reflect on  the standard of medical care.

## 2021-04-13 ENCOUNTER — Ambulatory Visit: Payer: Medicare PPO | Admitting: Rheumatology

## 2021-04-13 ENCOUNTER — Other Ambulatory Visit: Payer: Self-pay

## 2021-04-13 ENCOUNTER — Encounter: Payer: Self-pay | Admitting: Rheumatology

## 2021-04-13 VITALS — BP 126/85 | HR 79 | Ht 67.0 in | Wt 163.4 lb

## 2021-04-13 DIAGNOSIS — M79641 Pain in right hand: Secondary | ICD-10-CM

## 2021-04-13 DIAGNOSIS — R748 Abnormal levels of other serum enzymes: Secondary | ICD-10-CM

## 2021-04-13 DIAGNOSIS — Z96652 Presence of left artificial knee joint: Secondary | ICD-10-CM

## 2021-04-13 DIAGNOSIS — M0609 Rheumatoid arthritis without rheumatoid factor, multiple sites: Secondary | ICD-10-CM

## 2021-04-13 DIAGNOSIS — Z8669 Personal history of other diseases of the nervous system and sense organs: Secondary | ICD-10-CM

## 2021-04-13 DIAGNOSIS — Z96643 Presence of artificial hip joint, bilateral: Secondary | ICD-10-CM

## 2021-04-13 DIAGNOSIS — R21 Rash and other nonspecific skin eruption: Secondary | ICD-10-CM

## 2021-04-13 DIAGNOSIS — M1711 Unilateral primary osteoarthritis, right knee: Secondary | ICD-10-CM

## 2021-04-13 DIAGNOSIS — E0500 Thyrotoxicosis with diffuse goiter without thyrotoxic crisis or storm: Secondary | ICD-10-CM

## 2021-04-13 DIAGNOSIS — M79672 Pain in left foot: Secondary | ICD-10-CM

## 2021-04-13 DIAGNOSIS — Z8709 Personal history of other diseases of the respiratory system: Secondary | ICD-10-CM

## 2021-04-13 DIAGNOSIS — F17200 Nicotine dependence, unspecified, uncomplicated: Secondary | ICD-10-CM

## 2021-04-13 DIAGNOSIS — Z79899 Other long term (current) drug therapy: Secondary | ICD-10-CM

## 2021-04-13 DIAGNOSIS — M79671 Pain in right foot: Secondary | ICD-10-CM

## 2021-04-13 DIAGNOSIS — M79642 Pain in left hand: Secondary | ICD-10-CM

## 2021-04-13 NOTE — Patient Instructions (Addendum)
Standing Labs We placed an order today for your standing lab work.   Please have your standing labs drawn in October  and every 3 months  If possible, please have your labs drawn 2 weeks prior to your appointment so that the provider can discuss your results at your appointment.  Please note that you may see your imaging and lab results in MyChart before we have reviewed them. We may be awaiting multiple results to interpret others before contacting you. Please allow our office up to 72 hours to thoroughly review all of the results before contacting the office for clarification of your results.  We have open lab daily: Monday through Thursday from 1:30-4:30 PM and Friday from 1:30-4:00 PM at the office of Dr. Pollyann Savoy, [facility].   Please be advised, all patients with office appointments requiring lab work will take precedent over walk-in lab work.  If possible, please come for your lab work on Monday and Friday afternoons, as you may experience shorter wait times. The office is located at [address], [place], [place] [address] No appointment is necessary.   Labs are drawn by [facility]. Please bring your co-pay at the time of your lab draw.  You may receive a bill from [facility] for your lab work.  If you wish to have your labs drawn at another location, please call the office 24 hours in advance to send orders.  If you have any questions regarding directions or hours of operation,  please call [phone].   As a reminder, please drink plenty of water prior to coming for your lab work. Thanks!   If you test POSITIVE for COVID19 and have MILD to MODERATE symptoms: First, call your PCP if you would like to receive COVID19 treatment AND Hold your medications during the infection and for at least 1 week after your symptoms have resolved: Injectable medication (Benlysta, Cimzia, Cosentyx, Enbrel, Humira, Orencia, Remicade, Simponi, Stelara, Taltz,  Tremfya) Methotrexate Leflunomide (Arava) Mycophenolate (Cellcept) Harriette Ohara, Olumiant, or Rinvoq If you take Actemra or Kevzara, you DO NOT need to hold these for COVID19 infection.  If you test POSITIVE for COVID19 and have NO symptoms: First, call your PCP if you would like to receive COVID19 treatment AND Hold your medications for at least 10 days after the day that you tested positive Injectable medication (Benlysta, Cimzia, Cosentyx, Enbrel, Humira, Orencia, Remicade, Simponi, Stelara, Taltz, Tremfya) Methotrexate Leflunomide (Arava) Mycophenolate (Cellcept) Harriette Ohara, Olumiant, or Rinvoq If you take Actemra or Kevzara, you DO NOT need to hold these for COVID19 infection.  If you have signs or symptoms of an infection or start antibiotics: First, call your PCP for workup of your infection. Hold your medication through the infection, until you complete your antibiotics, and until symptoms resolve if you take the following: Injectable medication (Actemra, Benlysta, Cimzia, Cosentyx, Enbrel, Humira, Kevzara, Orencia, Remicade, Simponi, Stelara, Taltz, Tremfya) Methotrexate Leflunomide (Arava) Mycophenolate (Cellcept) Harriette Ohara, Olumiant, or Rinvoq  Heart Disease Prevention   Your inflammatory disease increases your risk of heart disease which includes heart attack, stroke, atrial fibrillation (irregular heartbeats), high blood pressure, heart failure and atherosclerosis (plaque in the arteries).  It is important to reduce your risk by:   Keep blood pressure, cholesterol, and blood sugar at healthy levels   Smoking Cessation   Maintain a healthy weight  BMI 20-25   Eat a healthy diet  Plenty of fresh fruit, vegetables, and whole grains  Limit saturated fats, foods high in sodium, and added sugars  DASH and Mediterranean diet   Increase physical activity  Recommend moderate physically activity for 150 minutes per week/ 30 minutes a day for five days a week These can be broken up  into three separate ten-minute sessions during the day.   Reduce Stress  Meditation, slow breathing exercises, yoga, coloring books  Dental visits twice a year   Vaccines You are taking a medication(s) that can suppress your immune system.  The following immunizations are recommended: Flu annually Covid-19  Td/Tdap (tetanus, diphtheria, pertussis) every 10 years Pneumonia (Prevnar 15 then Pneumovax 23 at least 1 year apart.  Alternatively, can take Prevnar 20 without needing additional dose) Shingrix (after age 69): 2 doses from 4 weeks to 6 months apart  Please check with your PCP to make sure you are up to date.

## 2021-04-14 LAB — COMPLETE METABOLIC PANEL WITH GFR
AG Ratio: 2 (calc) (ref 1.0–2.5)
ALT: 12 U/L (ref 6–29)
AST: 15 U/L (ref 10–35)
Albumin: 4.5 g/dL (ref 3.6–5.1)
Alkaline phosphatase (APISO): 88 U/L (ref 37–153)
BUN/Creatinine Ratio: 6 (calc) (ref 6–22)
BUN: 4 mg/dL — ABNORMAL LOW (ref 7–25)
CO2: 30 mmol/L (ref 20–32)
Calcium: 10.3 mg/dL (ref 8.6–10.4)
Chloride: 106 mmol/L (ref 98–110)
Creat: 0.72 mg/dL (ref 0.50–1.05)
Globulin: 2.3 g/dL (calc) (ref 1.9–3.7)
Glucose, Bld: 94 mg/dL (ref 65–99)
Potassium: 4.3 mmol/L (ref 3.5–5.3)
Sodium: 141 mmol/L (ref 135–146)
Total Bilirubin: 0.6 mg/dL (ref 0.2–1.2)
Total Protein: 6.8 g/dL (ref 6.1–8.1)
eGFR: 95 mL/min/{1.73_m2} (ref >=60)

## 2021-04-14 LAB — CBC WITH DIFFERENTIAL/PLATELET
Absolute Monocytes: 728 cells/uL (ref 200–950)
Basophils Absolute: 75 cells/uL (ref 0–200)
Basophils Relative: 0.7 %
Eosinophils Absolute: 75 cells/uL (ref 15–500)
Eosinophils Relative: 0.7 %
HCT: 44.2 % (ref 35.0–45.0)
Hemoglobin: 14.5 g/dL (ref 11.7–15.5)
Lymphs Abs: 3627 cells/uL (ref 850–3900)
MCH: 30.7 pg (ref 27.0–33.0)
MCHC: 32.8 g/dL (ref 32.0–36.0)
MCV: 93.6 fL (ref 80.0–100.0)
MPV: 11.2 fL (ref 7.5–12.5)
Monocytes Relative: 6.8 %
Neutro Abs: 6195 cells/uL (ref 1500–7800)
Neutrophils Relative %: 57.9 %
Platelets: 237 10*3/uL (ref 140–400)
RBC: 4.72 10*6/uL (ref 3.80–5.10)
RDW: 13 % (ref 11.0–15.0)
Total Lymphocyte: 33.9 %
WBC: 10.7 10*3/uL (ref 3.8–10.8)

## 2021-04-14 LAB — ALDOLASE: Aldolase: 4.7 U/L (ref <=8.1)

## 2021-04-14 LAB — CK: Total CK: 141 U/L (ref 29–143)

## 2021-04-14 NOTE — Progress Notes (Signed)
CBC, CMP and CK are normal.

## 2021-04-15 NOTE — Progress Notes (Signed)
Aldolase is normal.

## 2021-04-20 ENCOUNTER — Telehealth: Payer: Self-pay

## 2021-04-20 NOTE — Telephone Encounter (Signed)
Patient called stating Dr. Corliss Skains referred her to Dr. Allyne Gee.  Patient states she has left multiple messages to schedule an appointment, but has not received a return call.

## 2021-04-20 NOTE — Telephone Encounter (Signed)
I called patient, I called Dr. Zella Ball office and left message regarding referral, faxed referral

## 2021-04-28 ENCOUNTER — Other Ambulatory Visit: Payer: Self-pay

## 2021-04-28 ENCOUNTER — Ambulatory Visit
Admission: RE | Admit: 2021-04-28 | Discharge: 2021-04-28 | Disposition: A | Discharge status: Home / Self Care | Payer: Medicare PPO | Source: Ambulatory Visit | Attending: Family Medicine | Admitting: Family Medicine

## 2021-04-28 DIAGNOSIS — Z1231 Encounter for screening mammogram for malignant neoplasm of breast: Secondary | ICD-10-CM

## 2021-05-26 ENCOUNTER — Other Ambulatory Visit: Payer: Self-pay | Admitting: Rheumatology

## 2021-05-26 MED ORDER — ENBREL SURECLICK 50 MG/ML ~~LOC~~ SOAJ: 50.0000 mg | SUBCUTANEOUS | 0 refills | Status: DC

## 2021-05-26 NOTE — Telephone Encounter (Signed)
Next Visit: 07/13/2021  Last Visit: 04/13/2021  ZP:[id number] arthritis of multiple sites with negative rheumatoid factor  Current Dose per office note 04/13/2021: Enbrel 50 mg subcu weekly  Labs: 04/13/2021 CBC, CMP and CK are normal.  TB Gold: 02/23/2021   Okay to refill Enbrel?

## 2021-05-26 NOTE — Telephone Encounter (Signed)
Patient needs refill on Enbrel sent to [employer].

## 2021-06-30 NOTE — Progress Notes (Signed)
Office Visit Note  Patient: Leslie Brewer             Date of Birth: 1961/03/30           MRN: [id number]             PCP: Ellyn Hack, MD Referring: Ellyn Hack, MD Visit Date: 07/13/2021 Occupation: @GUAROCC @  Subjective:  Medication monitoring   History of Present Illness: Leslie Brewer is a 60 y.o. female with history of seronegative rheumatoid arthritis and osteoarthritis.  Patient is currently on Enbrel 50 mg subcutaneous injections once weekly.  She denies missing any doses recently. She is tolerating enbrel without any side effects or injection site reactions.  She has noticed a significant improvement in her joint pain and inflammation since restarting on Enbrel in June 2022.  She states that prior to restarting enbrel her pain was a 10/10 and is now a 2/10. She has been able to increase her level of activity.  Both hip replacements are doing well.  Her left knee replacement continues to cause chronic pain and instability.  She uses a cane to assist with ambulation.  She would like a referral to an orthopedist to discuss a revision in the future. She has not had any recent infections.    Activities of Daily Living:  Patient reports morning stiffness for 1.5-2 hours.   Patient Reports nocturnal pain.  Difficulty dressing/grooming: Reports Difficulty climbing stairs: Reports Difficulty getting out of chair: Reports Difficulty using hands for taps, buttons, cutlery, and/or writing: Reports  Review of Systems  Constitutional:  Negative for fatigue.  HENT:  Negative for mouth sores, mouth dryness and nose dryness.   Eyes:  Negative for pain, itching and dryness.  Respiratory:  Positive for shortness of breath and difficulty breathing.   Cardiovascular:  Negative for chest pain and palpitations.  Gastrointestinal:  Positive for constipation. Negative for blood in stool and diarrhea.  Endocrine: Negative for increased urination.  Genitourinary:  Negative for  difficulty urinating.  Musculoskeletal:  Positive for joint pain, joint pain, joint swelling and morning stiffness. Negative for myalgias, muscle tenderness and myalgias.  Skin:  Positive for rash. Negative for color change.  Allergic/Immunologic: Negative for susceptible to infections.  Neurological:  Positive for dizziness. Negative for numbness, headaches, memory loss and weakness.  Hematological:  Positive for bruising/bleeding tendency.  Psychiatric/Behavioral:  Negative for confusion.    PMFS History:  Patient Active Problem List   Diagnosis Date Noted   Rheumatoid arthritis involving multiple sites with positive rheumatoid factor (HCC) 02/23/2021   H/O bilateral hip replacements 02/23/2021   History of total left knee replacement 02/23/2021    History reviewed. No pertinent past medical history.  Family History  Problem Relation Age of Onset   Cancer Mother    Healthy Daughter    Past Surgical History:  Procedure Laterality Date   APPENDECTOMY     REPLACEMENT TOTAL KNEE Left 2020   TOTAL HIP ARTHROPLASTY Right    TOTAL HIP ARTHROPLASTY Left    TUBAL LIGATION     Social History   Social History Narrative   Not on file   Immunization History  Administered Date(s) Administered   Moderna Sars-Covid-2 Vaccination 01/01/2020, 01/29/2020, 08/29/2020, 06/13/2021     Objective: Vital Signs: BP 127/78 (BP Location: Left Arm, Patient Position: Sitting, Cuff Size: Normal)   Pulse 69   Ht 5\' 7"  (1.702 m)   Wt 156 lb (70.8 kg)   BMI 24.43 kg/m  Physical Exam Vitals and nursing note reviewed.  Constitutional:      Appearance: She is well-developed.  HENT:     Head: Normocephalic and atraumatic.  Eyes:     Conjunctiva/sclera: Conjunctivae normal.  Pulmonary:     Effort: Pulmonary effort is normal.  Abdominal:     Palpations: Abdomen is soft.  Musculoskeletal:     Cervical back: Normal range of motion.  Skin:    General: Skin is warm and dry.     Capillary  Refill: Capillary refill takes less than 2 seconds.  Neurological:     Mental Status: She is alert and oriented to person, place, and time.  Psychiatric:        Behavior: Behavior normal.     Musculoskeletal Exam: C-spine has good ROM with no discomfort. Painful ROM of both shoulder joints.  Elbow joints have good ROM with no tenderness or joint swelling.  Limited extension of the right wrist.  No tenderness or synovitis over MCP joints. Left hip replacement has limited ROM.  Right hip replacement has good ROM with no discomfort. Left knee replacement limited extension with valgus deformity. Right knee joint has good ROM with crepitus.  No warmth or effusion of the right knee joint.  Ankle joints have good ROM with no tenderness or joint swelling.   CDAI Exam: CDAI Score: 0.4  Patient Global: 2 mm; Provider Global: 2 mm Swollen: 0 ; Tender: 0  Joint Exam 07/13/2021   No joint exam has been documented for this visit   There is currently no information documented on the homunculus. Go to the Rheumatology activity and complete the homunculus joint exam.  Investigation: No additional findings.  Imaging: No results found.  Recent Labs: Lab Results  Component Value Date   WBC 10.7 04/13/2021   HGB 14.5 04/13/2021   PLT 237 04/13/2021   NA 141 04/13/2021   K 4.3 04/13/2021   CL 106 04/13/2021   CO2 30 04/13/2021   GLUCOSE 94 04/13/2021   BUN 4 (L) 04/13/2021   CREATININE 0.72 04/13/2021   BILITOT 0.6 04/13/2021   AST 15 04/13/2021   ALT 12 04/13/2021   PROT 6.8 04/13/2021   CALCIUM 10.3 04/13/2021   GFRAA 101 02/23/2021   QFTBGOLDPLUS NEGATIVE 02/23/2021    Speciality Comments: dxd in [place] 17 years ago.  Treated with prednisone many years, Enbrel for 6 years off and on  Procedures:  No procedures performed Allergies: Latex   Assessment / Plan:     Visit Diagnoses: Rheumatoid arthritis of multiple sites with negative rheumatoid factor (HCC) - RF negative, anti-CCP  negative, positive ANA. Dxd in [place].  Treated with prednisone for many years and then Enbrel off and on for the last 6 years: She has no synovitis on examination today.  She has limited extension of the right wrist with tenderness but no synovitis was noted.  She continues to experience intermittent migratory arthralgias and joint stiffness.  She is currently on Enbrel 50 mg sq injections once weekly which was restarted in June 2022.  She has noticed a significant improvement in her joint pain and inflammation since restarting on Enbrel. She has been able to increase her level of activity and has had less difficulty with performing ADLs. She continues to tolerate Enbrel without any side effects or injection site reactions.  She has not missed any doses of Enbrel recently.  She will continue on current therapy.  She is advised to notify us if she develops increased joint pain  or joint swelling.  She will follow-up in the office in 3 months.  High risk medication use - Enbrel 50 mg sq injections once weekly. CBC and CMP were drawn on 04/13/2021.  She is due to update lab work.  Orders for CBC and CMP were released.  Her next lab work will be due in January and every 3 months to monitor for drug toxicity.  TB Gold negative on 02/23/2021. - Plan: CBC with Differential/Platelet, COMPLETE METABOLIC PANEL WITH GFR, CBC with Differential/Platelet, COMPLETE METABOLIC PANEL WITH GFR She has not had any recent infections.  Discussed the importance of holding Enbrel if she develops signs or symptoms of an infection and to resume once infection has completely cleared. She has an upcoming appointment with dermatology in December 2022.  Discussed the importance of yearly skin examinations while on Enbrel due to the increased risk for nonmelanoma skin cancer.  Elevated CK: CK WNL-141 on 04/13/21.  She weight lifts on a regular basis. No muscle weakness.   Primary osteoarthritis of both hands: X-rays of both hands were updated on  02/23/2021 which were consistent with rheumatoid arthritis and osteoarthritis overlap.  No tenderness or inflammation on exam.  Complete fist formation bilaterally.   H/O bilateral hip replacements: Doing well.  She has limited ROM of the left hip replacement.   Primary osteoarthritis of right knee: She has good ROM of the right knee joint with no discomfort.  No warmth or effusion of the right knee.   History of total left knee replacement: Chronic pain.  She has significant instability in the left knee replacement which was performed in [place].  She has limited extension and valgus deformity.  She is at high risk for falling.  Patient requested a referral to discuss options including a possible revision.  A referral to Dr. Turner Daniels was placed today. She has been using a cane to assist with ambulation.   Primary osteoarthritis of both feet: X-rays of both feet were updated on 02/23/2021 which were consistent with osteoarthritis and rheumatoid arthritis overlap.  She has good range of motion of both ankle joints with no tenderness or joint swelling at this time.  Other medical conditions are listed as follows:   Graves' ophthalmopathy  H/O Guillain-Barre syndrome  History of COPD  Smoker  Orders: Orders Placed This Encounter  Procedures   CBC with Differential/Platelet   COMPLETE METABOLIC PANEL WITH GFR   CBC with Differential/Platelet   COMPLETE METABOLIC PANEL WITH GFR   Ambulatory referral to Orthopedic Surgery   No orders of the defined types were placed in this encounter.    Follow-Up Instructions: Return in about 3 months (around 10/13/2021) for Rheumatoid arthritis, Osteoarthritis.   Gearldine Bienenstock, PA-C  Note - This record has been created using Dragon software.  Chart creation errors have been sought, but may not always  have been located. Such creation errors do not reflect on  the standard of medical care.

## 2021-07-13 ENCOUNTER — Encounter: Payer: Self-pay | Admitting: Physician Assistant

## 2021-07-13 ENCOUNTER — Ambulatory Visit: Payer: Medicare PPO | Admitting: Physician Assistant

## 2021-07-13 ENCOUNTER — Other Ambulatory Visit: Payer: Self-pay

## 2021-07-13 VITALS — BP 127/78 | HR 69 | Ht 67.0 in | Wt 156.0 lb

## 2021-07-13 DIAGNOSIS — E0500 Thyrotoxicosis with diffuse goiter without thyrotoxic crisis or storm: Secondary | ICD-10-CM

## 2021-07-13 DIAGNOSIS — M0609 Rheumatoid arthritis without rheumatoid factor, multiple sites: Secondary | ICD-10-CM

## 2021-07-13 DIAGNOSIS — Z79899 Other long term (current) drug therapy: Secondary | ICD-10-CM | POA: Diagnosis not present

## 2021-07-13 DIAGNOSIS — M19071 Primary osteoarthritis, right ankle and foot: Secondary | ICD-10-CM

## 2021-07-13 DIAGNOSIS — M19041 Primary osteoarthritis, right hand: Secondary | ICD-10-CM

## 2021-07-13 DIAGNOSIS — M1711 Unilateral primary osteoarthritis, right knee: Secondary | ICD-10-CM

## 2021-07-13 DIAGNOSIS — Z8669 Personal history of other diseases of the nervous system and sense organs: Secondary | ICD-10-CM

## 2021-07-13 DIAGNOSIS — M79671 Pain in right foot: Secondary | ICD-10-CM

## 2021-07-13 DIAGNOSIS — Z8709 Personal history of other diseases of the respiratory system: Secondary | ICD-10-CM

## 2021-07-13 DIAGNOSIS — R748 Abnormal levels of other serum enzymes: Secondary | ICD-10-CM | POA: Diagnosis not present

## 2021-07-13 DIAGNOSIS — F17200 Nicotine dependence, unspecified, uncomplicated: Secondary | ICD-10-CM

## 2021-07-13 DIAGNOSIS — Z96643 Presence of artificial hip joint, bilateral: Secondary | ICD-10-CM

## 2021-07-13 DIAGNOSIS — Z96652 Presence of left artificial knee joint: Secondary | ICD-10-CM

## 2021-07-13 DIAGNOSIS — R2681 Unsteadiness on feet: Secondary | ICD-10-CM

## 2021-07-13 DIAGNOSIS — M19042 Primary osteoarthritis, left hand: Secondary | ICD-10-CM

## 2021-07-13 DIAGNOSIS — M79641 Pain in right hand: Secondary | ICD-10-CM

## 2021-07-13 DIAGNOSIS — M19072 Primary osteoarthritis, left ankle and foot: Secondary | ICD-10-CM

## 2021-07-13 NOTE — Patient Instructions (Signed)
Standing Labs We placed an order today for your standing lab work.   Please have your standing labs drawn in January and every 3 months   If possible, please have your labs drawn 2 weeks prior to your appointment so that the provider can discuss your results at your appointment.  Please note that you may see your imaging and lab results in MyChart before we have reviewed them. We may be awaiting multiple results to interpret others before contacting you. Please allow our office up to 72 hours to thoroughly review all of the results before contacting the office for clarification of your results.  We have open lab daily: Monday through Thursday from 1:30-4:30 PM and Friday from 1:30-4:00 PM at the office of Dr. Shaili Deveshwar, [facility].   Please be advised, all patients with office appointments requiring lab work will take precedent over walk-in lab work.  If possible, please come for your lab work on Monday and Friday afternoons, as you may experience shorter wait times. The office is located at [address], [address], [place], [place] [address] No appointment is necessary.   Labs are drawn by [facility]. Please bring your co-pay at the time of your lab draw.  You may receive a bill from [facility] for your lab work.  If you wish to have your labs drawn at another location, please call the office 24 hours in advance to send orders.  If you have any questions regarding directions or hours of operation,  please call [phone].   As a reminder, please drink plenty of water prior to coming for your lab work. Thanks!  

## 2021-07-14 LAB — COMPLETE METABOLIC PANEL WITH GFR
AG Ratio: 2 (calc) (ref 1.0–2.5)
ALT: 16 U/L (ref 6–29)
AST: 18 U/L (ref 10–35)
Albumin: 4.5 g/dL (ref 3.6–5.1)
Alkaline phosphatase (APISO): 80 U/L (ref 37–153)
BUN/Creatinine Ratio: 7 (calc) (ref 6–22)
BUN: 5 mg/dL — ABNORMAL LOW (ref 7–25)
CO2: 25 mmol/L (ref 20–32)
Calcium: 9.7 mg/dL (ref 8.6–10.4)
Chloride: 107 mmol/L (ref 98–110)
Creat: 0.69 mg/dL (ref 0.50–1.05)
Globulin: 2.3 g/dL (calc) (ref 1.9–3.7)
Glucose, Bld: 77 mg/dL (ref 65–99)
Potassium: 4.3 mmol/L (ref 3.5–5.3)
Sodium: 143 mmol/L (ref 135–146)
Total Bilirubin: 0.4 mg/dL (ref 0.2–1.2)
Total Protein: 6.8 g/dL (ref 6.1–8.1)
eGFR: 99 mL/min/{1.73_m2} (ref >=60)

## 2021-07-14 LAB — CBC WITH DIFFERENTIAL/PLATELET
Absolute Monocytes: 794 cells/uL (ref 200–950)
Basophils Absolute: 78 cells/uL (ref 0–200)
Basophils Relative: 0.8 %
Eosinophils Absolute: 69 cells/uL (ref 15–500)
Eosinophils Relative: 0.7 %
HCT: 43.8 % (ref 35.0–45.0)
Hemoglobin: 14.6 g/dL (ref 11.7–15.5)
Lymphs Abs: 4145 cells/uL — ABNORMAL HIGH (ref 850–3900)
MCH: 31.1 pg (ref 27.0–33.0)
MCHC: 33.3 g/dL (ref 32.0–36.0)
MCV: 93.4 fL (ref 80.0–100.0)
MPV: 10.8 fL (ref 7.5–12.5)
Monocytes Relative: 8.1 %
Neutro Abs: 4714 cells/uL (ref 1500–7800)
Neutrophils Relative %: 48.1 %
Platelets: 265 10*3/uL (ref 140–400)
RBC: 4.69 10*6/uL (ref 3.80–5.10)
RDW: 12.9 % (ref 11.0–15.0)
Total Lymphocyte: 42.3 %
WBC: 9.8 10*3/uL (ref 3.8–10.8)

## 2021-07-14 NOTE — Progress Notes (Signed)
Absolute lymphocyte count is slightly elevated.  WBC count is WNL.  Rest of CBC WNL.  We will continue to monitor.   BUN is slightly low-5. Rest of CMP WNL.  We will continue to monitor lab work closely.

## 2021-07-25 ENCOUNTER — Other Ambulatory Visit (HOSPITAL_COMMUNITY): Payer: Self-pay | Admitting: Orthopedic Surgery

## 2021-07-25 ENCOUNTER — Other Ambulatory Visit (HOSPITAL_BASED_OUTPATIENT_CLINIC_OR_DEPARTMENT_OTHER): Payer: Self-pay | Admitting: Orthopedic Surgery

## 2021-07-25 DIAGNOSIS — Z96652 Presence of left artificial knee joint: Secondary | ICD-10-CM

## 2021-07-25 DIAGNOSIS — M25562 Pain in left knee: Secondary | ICD-10-CM

## 2021-08-22 ENCOUNTER — Encounter (HOSPITAL_COMMUNITY)
Admission: RE | Admit: 2021-08-22 | Discharge: 2021-08-22 | Disposition: A | Discharge status: Home / Self Care | Payer: Medicare PPO | Source: Ambulatory Visit | Attending: Orthopedic Surgery | Admitting: Orthopedic Surgery

## 2021-08-22 ENCOUNTER — Other Ambulatory Visit: Payer: Self-pay

## 2021-08-22 DIAGNOSIS — Z96652 Presence of left artificial knee joint: Secondary | ICD-10-CM | POA: Insufficient documentation

## 2021-08-22 DIAGNOSIS — M25562 Pain in left knee: Secondary | ICD-10-CM | POA: Insufficient documentation

## 2021-08-22 MED ORDER — TECHNETIUM TC 99M MEDRONATE IV KIT
20.0000 | PACK | Freq: Once | INTRAVENOUS | Status: AC | PRN
  Administered 2021-08-22: 20.5 via INTRAVENOUS

## 2021-08-25 ENCOUNTER — Telehealth: Payer: Self-pay | Admitting: Pharmacist

## 2021-08-25 ENCOUNTER — Other Ambulatory Visit: Payer: Self-pay | Admitting: *Deleted

## 2021-08-25 MED ORDER — ENBREL SURECLICK 50 MG/ML ~~LOC~~ SOAJ: 50.0000 mg | SUBCUTANEOUS | 0 refills | Status: DC

## 2021-08-25 NOTE — Telephone Encounter (Signed)
Patient requesting a refill on Enbrel to be sent to [employer].   Next Visit: 10/12/2021  Last Visit: 07/13/2021  Last Fill: 05/26/2021  BM:[id number] arthritis of multiple sites with negative rheumatoid factor  Current Dose per office note 07/13/2021: Enbrel 50 mg sq injections once weekly  Labs: 07/13/2021  Absolute lymphocyte count is slightly elevated.  WBC count is WNL.  Rest of CBC WNL. BUN is slightly low-5. Rest of CMP WNL.    TB Gold: 02/23/2021 Neg    Okay to refill Enbrel?

## 2021-08-25 NOTE — Telephone Encounter (Addendum)
ATC patient for Enbrel renewal. Unable to reach - left VM requesting return call Monday morning. She will need to complete [employer] PAP application for 2023 renewal  ----- Message from Henriette Combs, LPN sent at 82/01/36 10:41 AM EST ----- Patient contacted the office and asked about her patient assistance application and what needed to be done for it to be renewed for next year. Please contact the patient to advise.

## 2021-08-28 NOTE — Telephone Encounter (Signed)
Spoke with patient about Enbrel PAP renewal. She states she received application. She will plan to drop off application to clinic either this week or next week.  Chesley Mires, PharmD, MPH, BCPS Clinical Pharmacist (Rheumatology and Pulmonology)

## 2021-08-31 ENCOUNTER — Ambulatory Visit: Payer: Medicare PPO | Admitting: Physician Assistant

## 2021-09-08 NOTE — Telephone Encounter (Signed)
ATC patient to determine status of Enbrel PAP application. Unable to reach - left VM requesting she return application to clinic ASAP so it can get processed on time  Chesley Mires, PharmD, MPH, BCPS Clinical Pharmacist (Rheumatology and Pulmonology)

## 2021-09-14 NOTE — Telephone Encounter (Signed)
ATC patient regarding Enbrel PAP renewal application. She states she needs to take an Benedetto Goad to get to the clinic. I advised that she could return to [employer] directly herself via mail.   She states she will plan to complete today and mail out. Will submit provider portion upcoming week since it may take several days for [employer] to receive patient's portion of application via mail given holiday period  Chesley Mires, PharmD, MPH, BCPS Clinical Pharmacist (Rheumatology and Pulmonology)

## 2021-09-19 ENCOUNTER — Telehealth: Payer: Self-pay

## 2021-09-19 NOTE — Telephone Encounter (Signed)
PA renewal initiated automatically by CoverMyMeds.  Submitted a Prior Authorization request to [facility] for ENBREL via CoverMyMeds. Will update once we receive a response.   Key: [id number]

## 2021-09-19 NOTE — Telephone Encounter (Signed)
Disregard

## 2021-09-20 NOTE — Telephone Encounter (Signed)
Received notification from [facility] regarding a prior authorization for ENBREL. Authorization has been APPROVED from 09/24/2020 to 09/23/2022.    Authorization # [id number]

## 2021-10-02 NOTE — Progress Notes (Signed)
Office Visit Note  Patient: Leslie Brewer             Date of Birth: 11-Aug-1961           MRN: [id number]             PCP: Roselee Nova, MD Referring: Roselee Nova, MD Visit Date: 10/12/2021 Occupation: @GUAROCC @  Subjective:  Left knee pain  History of Present Illness: Leslie Brewer is a 61 y.o. female with history of rheumatoid arthritis and osteoarthritis.  She states she continues to have pain and discomfort in the left knee joint.  She was evaluated by an orthopedic surgeon and she decided against surgery.  None of the other joints are painful.  She has stiffness in some of her joints but no swelling.  She has been tolerating Enbrel without any side effects.  She states that her skin examination is due sometimes in June.  Activities of Daily Living:  Patient reports morning stiffness for 30 minutes.   Patient Reports nocturnal pain.  Difficulty dressing/grooming: Reports Difficulty climbing stairs: Reports Difficulty getting out of chair: Reports Difficulty using hands for taps, buttons, cutlery, and/or writing: Reports  Review of Systems  Constitutional:  Positive for fatigue.  HENT:  Negative for mouth sores, mouth dryness and nose dryness.   Eyes:  Negative for pain, itching and dryness.  Respiratory:  Negative for shortness of breath and difficulty breathing.   Cardiovascular:  Negative for chest pain and palpitations.  Gastrointestinal:  Positive for constipation. Negative for blood in stool and diarrhea.  Endocrine: Positive for increased urination.  Genitourinary:  Negative for difficulty urinating.  Musculoskeletal:  Positive for joint pain, joint pain, joint swelling and morning stiffness. Negative for myalgias, muscle tenderness and myalgias.  Skin:  Negative for color change and sensitivity to sunlight.  Allergic/Immunologic: Negative for susceptible to infections.  Neurological:  Positive for dizziness and numbness. Negative for headaches, memory loss  and weakness.  Hematological:  Negative for bruising/bleeding tendency.  Psychiatric/Behavioral:  Negative for confusion.    PMFS History:  Patient Active Problem List   Diagnosis Date Noted   Rheumatoid arthritis involving multiple sites with positive rheumatoid factor ([place]) 02/23/2021   H/O bilateral hip replacements 02/23/2021   History of total left knee replacement 02/23/2021    History reviewed. No pertinent past medical history.  Family History  Problem Relation Age of Onset   Cancer Mother    Healthy Daughter    Past Surgical History:  Procedure Laterality Date   APPENDECTOMY     REPLACEMENT TOTAL KNEE Left 2020   TOTAL HIP ARTHROPLASTY Right    TOTAL HIP ARTHROPLASTY Left    TUBAL LIGATION     Social History   Social History Narrative   Not on file   Immunization History  Administered Date(s) Administered   Moderna Sars-Covid-2 Vaccination 01/01/2020, 01/29/2020, 08/29/2020, 06/13/2021     Objective: Vital Signs: BP 134/88 (BP Location: Left Arm, Patient Position: Sitting, Cuff Size: Small)    Pulse (!) 118    Resp 12    Ht 5\' 7"  (1.702 m)    Wt 157 lb (71.2 kg)    BMI 24.59 kg/m    Physical Exam Vitals and nursing note reviewed.  Constitutional:      Appearance: She is well-developed.  HENT:     Head: Normocephalic and atraumatic.  Eyes:     Conjunctiva/sclera: Conjunctivae normal.  Cardiovascular:     Rate and Rhythm: Normal rate  and regular rhythm.     Heart sounds: Normal heart sounds.  Pulmonary:     Effort: Pulmonary effort is normal.     Breath sounds: Normal breath sounds.  Abdominal:     General: Bowel sounds are normal.     Palpations: Abdomen is soft.  Musculoskeletal:     Cervical back: Normal range of motion.  Lymphadenopathy:     Cervical: No cervical adenopathy.  Skin:    General: Skin is warm and dry.     Capillary Refill: Capillary refill takes less than 2 seconds.  Neurological:     Mental Status: She is alert and oriented to  person, place, and time.  Psychiatric:        Behavior: Behavior normal.     Musculoskeletal Exam: C-spine was in good range of motion.  Shoulder joints, elbow joints, wrist joints, MCPs PIPs and DIPs with good range of motion with no synovitis.  Hip joints are in good range of motion.  Hip joints are replaced.  She had discomfort range of motion of her left knee joint which is replaced.  Right knee joint was in good range of motion without any warmth or swelling.  She had no tenderness over ankles or MTPs.  CDAI Exam: CDAI Score: 0.6  Patient Global: 3 mm; Provider Global: 3 mm Swollen: 0 ; Tender: 0  Joint Exam 10/12/2021   No joint exam has been documented for this visit   There is currently no information documented on the homunculus. Go to the Rheumatology activity and complete the homunculus joint exam.  Investigation: No additional findings.  Imaging: No results found.  Recent Labs: Lab Results  Component Value Date   WBC 9.8 07/13/2021   HGB 14.6 07/13/2021   PLT 265 07/13/2021   NA 143 07/13/2021   K 4.3 07/13/2021   CL 107 07/13/2021   CO2 25 07/13/2021   GLUCOSE 77 07/13/2021   BUN 5 (L) 07/13/2021   CREATININE 0.69 07/13/2021   BILITOT 0.4 07/13/2021   AST 18 07/13/2021   ALT 16 07/13/2021   PROT 6.8 07/13/2021   CALCIUM 9.7 07/13/2021   GFRAA 101 02/23/2021   QFTBGOLDPLUS NEGATIVE 02/23/2021    Speciality Comments: dxd in [place] 17 years ago.  Treated with prednisone many years, Enbrel for 6 years off and on  Procedures:  No procedures performed Allergies: Latex   Assessment / Plan:     Visit Diagnoses: Rheumatoid arthritis of multiple sites with negative rheumatoid factor (HCC) - RF negative, anti-CCP negative, positive ANA. Dxd in [place].  Treated with prednisone for many years and then Enbrel off and on for the last 6 years: She has been clinically doing well with no synovitis on examination today.  She has been tolerating Enbrel well.  High risk  medication use - Enbrel 50 mg sq injections once weekly.  Last labs from July 21, 2021 were normal.  TB gold was negative on February 23, 2021.  We will get labs today and then every 3 months to monitor for drug toxicity.  TB gold will be due in June 2023.  Information regarding realization was placed in the AVS.  She was also advised to hold Enbrel in case she develops an infection.  She will resume Enbrel after the infection resolves.  Annual skin examination to screen for skin cancer was advised while she is taking Enbrel.  Patient stated that she has an appointment in June with a dermatologist.- Plan: CBC with Differential/Platelet, COMPLETE METABOLIC PANEL  WITH GFR  Elevated CK - CK WNL-141 on 04/13/21.  She weight lifts on a regular basis.  Primary osteoarthritis of both hands - X-rays of both hands were updated on 02/23/2021 which were consistent with rheumatoid arthritis and osteoarthritis overlap.  Joint protection muscle strengthening was discussed.  H/O bilateral hip replacements-she had good range of motion of bilateral hip joints.  Primary osteoarthritis of right knee-she had good range of motion of her knee joints without any warmth swelling or effusion.  History of total left knee replacement-she continues to have ongoing pain in her left knee joint.  She states she was advised repeat surgery but she decided against it.    Primary osteoarthritis of both feet - X-rays of both feet were updated on 02/23/2021 which were consistent with osteoarthritis and rheumatoid arthritis overlap.  She had no discomfort on palpation today.  Graves' ophthalmopathy  History of COPD  H/O Guillain-Barre syndrome  Smoker  Orders: Orders Placed This Encounter  Procedures   CBC with Differential/Platelet   COMPLETE METABOLIC PANEL WITH GFR   No orders of the defined types were placed in this encounter.    Follow-Up Instructions: Return in about 5 months (around 03/12/2022) for Rheumatoid arthritis,  Osteoarthritis.   Bo Merino, MD  Note - This record has been created using Editor, commissioning.  Chart creation errors have been sought, but may not always  have been located. Such creation errors do not reflect on  the standard of medical care.

## 2021-10-12 ENCOUNTER — Other Ambulatory Visit: Payer: Self-pay

## 2021-10-12 ENCOUNTER — Ambulatory Visit (INDEPENDENT_AMBULATORY_CARE_PROVIDER_SITE_OTHER): Payer: Medicare PPO | Admitting: Rheumatology

## 2021-10-12 ENCOUNTER — Encounter: Payer: Self-pay | Admitting: Rheumatology

## 2021-10-12 VITALS — BP 134/88 | HR 118 | Resp 12 | Ht 67.0 in | Wt 157.0 lb

## 2021-10-12 DIAGNOSIS — M19041 Primary osteoarthritis, right hand: Secondary | ICD-10-CM | POA: Diagnosis not present

## 2021-10-12 DIAGNOSIS — M19071 Primary osteoarthritis, right ankle and foot: Secondary | ICD-10-CM

## 2021-10-12 DIAGNOSIS — M1711 Unilateral primary osteoarthritis, right knee: Secondary | ICD-10-CM

## 2021-10-12 DIAGNOSIS — R748 Abnormal levels of other serum enzymes: Secondary | ICD-10-CM

## 2021-10-12 DIAGNOSIS — Z96643 Presence of artificial hip joint, bilateral: Secondary | ICD-10-CM

## 2021-10-12 DIAGNOSIS — Z8669 Personal history of other diseases of the nervous system and sense organs: Secondary | ICD-10-CM

## 2021-10-12 DIAGNOSIS — Z8709 Personal history of other diseases of the respiratory system: Secondary | ICD-10-CM

## 2021-10-12 DIAGNOSIS — Z79899 Other long term (current) drug therapy: Secondary | ICD-10-CM

## 2021-10-12 DIAGNOSIS — Z96652 Presence of left artificial knee joint: Secondary | ICD-10-CM

## 2021-10-12 DIAGNOSIS — M0609 Rheumatoid arthritis without rheumatoid factor, multiple sites: Secondary | ICD-10-CM | POA: Diagnosis not present

## 2021-10-12 DIAGNOSIS — M19072 Primary osteoarthritis, left ankle and foot: Secondary | ICD-10-CM

## 2021-10-12 DIAGNOSIS — M19042 Primary osteoarthritis, left hand: Secondary | ICD-10-CM

## 2021-10-12 DIAGNOSIS — E0500 Thyrotoxicosis with diffuse goiter without thyrotoxic crisis or storm: Secondary | ICD-10-CM

## 2021-10-12 DIAGNOSIS — F17200 Nicotine dependence, unspecified, uncomplicated: Secondary | ICD-10-CM

## 2021-10-12 NOTE — Patient Instructions (Addendum)
Hand Exercises Hand exercises can be helpful for almost anyone. These exercises can strengthen the hands, improve flexibility and movement, and increase blood flow to the hands. These results can make work and daily tasks easier. Hand exercises can be especially helpful for people who have joint pain from arthritis or have nerve damage from overuse (carpal tunnel syndrome). These exercises can also help people who have injured a hand. Exercises Most of these hand exercises are gentle stretching and motion exercises. It is usually safe to do them often throughout the day. Warming up your hands before exercise may help to reduce stiffness. You can do this with gentle massage or by placing your hands in warm water for 10-15 minutes. It is normal to feel some stretching, pulling, tightness, or mild discomfort as you begin new exercises. This will gradually improve. Stop an exercise right away if you feel sudden, severe pain or your pain gets worse. Ask your health care provider which exercises are best for you. Knuckle bend or "claw" fist  Stand or sit with your arm, hand, and all five fingers pointed straight up. Make sure to keep your wrist straight during the exercise. Gently bend your fingers down toward your palm until the tips of your fingers are touching the top of your palm. Keep your big knuckle straight and just bend the small knuckles in your fingers. Hold this position for __________ seconds. Straighten (extend) your fingers back to the starting position. Repeat this exercise 5-10 times with each hand. Full finger fist  Stand or sit with your arm, hand, and all five fingers pointed straight up. Make sure to keep your wrist straight during the exercise. Gently bend your fingers into your palm until the tips of your fingers are touching the middle of your palm. Hold this position for __________ seconds. Extend your fingers back to the starting position, stretching every joint fully. Repeat  this exercise 5-10 times with each hand. Straight fist Stand or sit with your arm, hand, and all five fingers pointed straight up. Make sure to keep your wrist straight during the exercise. Gently bend your fingers at the big knuckle, where your fingers meet your hand, and the middle knuckle. Keep the knuckle at the tips of your fingers straight and try to touch the bottom of your palm. Hold this position for __________ seconds. Extend your fingers back to the starting position, stretching every joint fully. Repeat this exercise 5-10 times with each hand. Tabletop  Stand or sit with your arm, hand, and all five fingers pointed straight up. Make sure to keep your wrist straight during the exercise. Gently bend your fingers at the big knuckle, where your fingers meet your hand, as far down as you can while keeping the small knuckles in your fingers straight. Think of forming a tabletop with your fingers. Hold this position for __________ seconds. Extend your fingers back to the starting position, stretching every joint fully. Repeat this exercise 5-10 times with each hand. Finger spread  Place your hand flat on a table with your palm facing down. Make sure your wrist stays straight as you do this exercise. Spread your fingers and thumb apart from each other as far as you can until you feel a gentle stretch. Hold this position for __________ seconds. Bring your fingers and thumb tight together again. Hold this position for __________ seconds. Repeat this exercise 5-10 times with each hand. Making circles  Stand or sit with your arm, hand, and all five fingers pointed  straight up. Make sure to keep your wrist straight during the exercise. Make a circle by touching the tip of your thumb to the tip of your index finger. Hold for __________ seconds. Then open your hand wide. Repeat this motion with your thumb and each finger on your hand. Repeat this exercise 5-10 times with each hand. Thumb  motion  Sit with your forearm resting on a table and your wrist straight. Your thumb should be facing up toward the ceiling. Keep your fingers relaxed as you move your thumb. Lift your thumb up as high as you can toward the ceiling. Hold for __________ seconds. Bend your thumb across your palm as far as you can, reaching the tip of your thumb for the small finger (pinkie) side of your palm. Hold for __________ seconds. Repeat this exercise 5-10 times with each hand. Grip strengthening  Hold a stress ball or other soft ball in the middle of your hand. Slowly increase the pressure, squeezing the ball as much as you can without causing pain. Think of bringing the tips of your fingers into the middle of your palm. All of your finger joints should bend when doing this exercise. Hold your squeeze for __________ seconds, then relax. Repeat this exercise 5-10 times with each hand. Contact a health care provider if: Your hand pain or discomfort gets much worse when you do an exercise. Your hand pain or discomfort does not improve within 2 hours after you exercise. If you have any of these problems, stop doing these exercises right away. Do not do them again unless your health care provider says that you can. Get help right away if: You develop sudden, severe hand pain or swelling. If this happens, stop doing these exercises right away. Do not do them again unless your health care provider says that you can. This information is not intended to replace advice given to you by your health care provider. Make sure you discuss any questions you have with your health care provider. Document Revised: 12/29/2020 Document Reviewed: 12/29/2020 Elsevier Patient Education  2022 [employer]     Standing Labs We placed an order today for your standing lab work.   Please have your standing labs drawn in April and every 3 months  If possible, please have your labs drawn 2 weeks prior to your appointment so  that the provider can discuss your results at your appointment.  Please note that you may see your imaging and lab results in MyChart before we have reviewed them. We may be awaiting multiple results to interpret others before contacting you. Please allow our office up to 72 hours to thoroughly review all of the results before contacting the office for clarification of your results.  We have open lab daily: Monday through Thursday from 1:30-4:30 PM and Friday from 1:30-4:00 PM at the office of Dr. Pollyann Savoy, [facility].   Please be advised, all patients with office appointments requiring lab work will take precedent over walk-in lab work.  If possible, please come for your lab work on Monday and Friday afternoons, as you may experience shorter wait times. The office is located at [address], [address], [place], [place] [address] No appointment is necessary.   Labs are drawn by [facility]. Please bring your co-pay at the time of your lab draw.  You may receive a bill from [facility] for your lab work.  Please note if you are on Hydroxychloroquine and and an order has been placed for a  Hydroxychloroquine level, you will need to have it drawn 4 hours or more after your last dose.  If you wish to have your labs drawn at another location, please call the office 24 hours in advance to send orders.  If you have any questions regarding directions or hours of operation,  please call [phone].   As a reminder, please drink plenty of water prior to coming for your lab work. Thanks!   Vaccines You are taking a medication(s) that can suppress your immune system.  The following immunizations are recommended: Flu annually Covid-19  Td/Tdap (tetanus, diphtheria, pertussis) every 10 years Pneumonia (Prevnar 15 then Pneumovax 23 at least 1 year apart.  Alternatively, can take Prevnar 20 without needing additional dose) Shingrix: 2 doses from 4 weeks to 6 months apart  Please check  with your PCP to make sure you are up to date.   If you have signs or symptoms of an infection or start antibiotics: First, call your PCP for workup of your infection. Hold your medication through the infection, until you complete your antibiotics, and until symptoms resolve if you take the following: Injectable medication (Actemra, Benlysta, Cimzia, Cosentyx, Enbrel, Humira, Kevzara, Orencia, Remicade, Simponi, Stelara, Taltz, Tremfya) Methotrexate Leflunomide (Arava) Mycophenolate (Cellcept) Harriette Ohara, Olumiant, or Rinvoq  Please get annual skin examination to screen for skin cancer while you are on Enbrel.

## 2021-10-13 ENCOUNTER — Telehealth: Payer: Self-pay

## 2021-10-13 LAB — COMPLETE METABOLIC PANEL WITH GFR
AG Ratio: 2 (calc) (ref 1.0–2.5)
ALT: 13 U/L (ref 6–29)
AST: 19 U/L (ref 10–35)
Albumin: 4.5 g/dL (ref 3.6–5.1)
Alkaline phosphatase (APISO): 101 U/L (ref 37–153)
BUN: 9 mg/dL (ref 7–25)
CO2: 31 mmol/L (ref 20–32)
Calcium: 9.9 mg/dL (ref 8.6–10.4)
Chloride: 106 mmol/L (ref 98–110)
Creat: 0.73 mg/dL (ref 0.50–1.05)
Globulin: 2.2 g/dL (calc) (ref 1.9–3.7)
Glucose, Bld: 85 mg/dL (ref 65–99)
Potassium: 4.4 mmol/L (ref 3.5–5.3)
Sodium: 141 mmol/L (ref 135–146)
Total Bilirubin: 0.8 mg/dL (ref 0.2–1.2)
Total Protein: 6.7 g/dL (ref 6.1–8.1)
eGFR: 94 mL/min/{1.73_m2} (ref >=60)

## 2021-10-13 LAB — CBC WITH DIFFERENTIAL/PLATELET
Absolute Monocytes: 668 cells/uL (ref 200–950)
Basophils Absolute: 74 cells/uL (ref 0–200)
Basophils Relative: 0.7 %
Eosinophils Absolute: 95 cells/uL (ref 15–500)
Eosinophils Relative: 0.9 %
HCT: 45.1 % — ABNORMAL HIGH (ref 35.0–45.0)
Hemoglobin: 15 g/dL (ref 11.7–15.5)
Lymphs Abs: 3943 cells/uL — ABNORMAL HIGH (ref 850–3900)
MCH: 31 pg (ref 27.0–33.0)
MCHC: 33.3 g/dL (ref 32.0–36.0)
MCV: 93.2 fL (ref 80.0–100.0)
MPV: 10.9 fL (ref 7.5–12.5)
Monocytes Relative: 6.3 %
Neutro Abs: 5819 cells/uL (ref 1500–7800)
Neutrophils Relative %: 54.9 %
Platelets: 250 10*3/uL (ref 140–400)
RBC: 4.84 10*6/uL (ref 3.80–5.10)
RDW: 12.5 % (ref 11.0–15.0)
Total Lymphocyte: 37.2 %
WBC: 10.6 10*3/uL (ref 3.8–10.8)

## 2021-10-13 NOTE — Telephone Encounter (Signed)
Patient called stating she is experiencing pain in both hands with her knuckles buckling and difficulty bending.  Patient requested a return call to let her know what she should do.

## 2021-10-13 NOTE — Telephone Encounter (Signed)
Submitted Patient Assistance RENEWAL Application to [employer] for ENBREL along with provider portion, patient portion ,med list, insurance card copy, and PA. Will update patient when we receive a response.  Fax# [phone] Phone# [phone]  Chesley Mires, PharmD, MPH, BCPS Clinical Pharmacist (Rheumatology and Pulmonology)

## 2021-10-13 NOTE — Progress Notes (Signed)
CBC and CMP are normal.

## 2021-10-16 NOTE — Telephone Encounter (Signed)
Patient states she is having pain in her hands. She states the pain is in her joints and they "cramp up". Patient states she forgot to mention it at her appointment. Patient states she is taking Enbrel and denies missing any doses. She would like to know what she can do for this. Please advise.

## 2021-10-17 NOTE — Telephone Encounter (Signed)
She had no synovitis on examination at her most recent follow up visit.    Hand cramping can be common with underlying osteoarthritis.  Discuss the importance of joint protection and muscle strengthening.  Please recommend hand exercises.  If her symptoms persist or worsen she should schedule a sooner follow up visit for further evaluation.

## 2021-10-17 NOTE — Telephone Encounter (Signed)
Patient advised she had no synovitis on examination at her most recent follow up visit.     Hand cramping can be common with underlying osteoarthritis.  Discuss the importance of joint protection and muscle strengthening.  Recommended hand exercises. Placed those in the mail for patient. Patient advised if her symptoms persist or worsen she should schedule a sooner follow up visit for further evaluation.

## 2021-10-23 NOTE — Telephone Encounter (Signed)
Called [employer] for update on patient's Enbrel application. Per rep, they need last four digits of SSN. Provided over phone today.  Phone: [phone]  Chesley Miresevki Aunesti Pellegrino, PharmD, MPH, BCPS Clinical Pharmacist (Rheumatology and Pulmonology)

## 2021-10-30 NOTE — Telephone Encounter (Signed)
Called [employer] for update on patient's Enbrel renewal application. Per rep, it is currently being processed and should receive decision within 24-48 hours.  Phone: [phone]  Chesley Mires, PharmD, MPH, BCPS Clinical Pharmacist (Rheumatology and Pulmonology)

## 2021-10-30 NOTE — Telephone Encounter (Signed)
Patient called and stated that she spoke with [employer] last week and was told diagnosis code on Enbrel rx form was missing. Will refax prescriber form  Chesley Miresevki Helder Crisafulli, PharmD, MPH, BCPS Clinical Pharmacist (Rheumatology and Pulmonology)

## 2021-11-08 NOTE — Telephone Encounter (Signed)
Received a verbal confirmation from  [employer] regarding an approval for ENBREL patient assistance through 09/23/22.   Phone number: [phone]  Chesley Mires, PharmD, MPH, BCPS Clinical Pharmacist (Rheumatology and Pulmonology)

## 2022-02-15 ENCOUNTER — Other Ambulatory Visit: Payer: Self-pay | Admitting: Registered Nurse

## 2022-02-15 DIAGNOSIS — R0989 Other specified symptoms and signs involving the circulatory and respiratory systems: Secondary | ICD-10-CM

## 2022-02-16 ENCOUNTER — Other Ambulatory Visit: Payer: Self-pay | Admitting: Family Medicine

## 2022-02-16 ENCOUNTER — Other Ambulatory Visit: Payer: Self-pay | Admitting: Registered Nurse

## 2022-02-16 DIAGNOSIS — E2839 Other primary ovarian failure: Secondary | ICD-10-CM

## 2022-02-22 ENCOUNTER — Other Ambulatory Visit: Payer: Self-pay | Admitting: Registered Nurse

## 2022-02-22 DIAGNOSIS — R0989 Other specified symptoms and signs involving the circulatory and respiratory systems: Secondary | ICD-10-CM

## 2022-02-23 ENCOUNTER — Ambulatory Visit
Admission: RE | Admit: 2022-02-23 | Discharge: 2022-02-23 | Disposition: A | Discharge status: Home / Self Care | Payer: Medicare PPO | Source: Ambulatory Visit | Attending: Registered Nurse | Admitting: Registered Nurse

## 2022-02-23 DIAGNOSIS — R0989 Other specified symptoms and signs involving the circulatory and respiratory systems: Secondary | ICD-10-CM

## 2022-02-27 ENCOUNTER — Encounter: Payer: Self-pay | Admitting: Physician Assistant

## 2022-02-27 ENCOUNTER — Ambulatory Visit (INDEPENDENT_AMBULATORY_CARE_PROVIDER_SITE_OTHER): Payer: Medicare PPO | Admitting: Physician Assistant

## 2022-02-27 DIAGNOSIS — L720 Epidermal cyst: Secondary | ICD-10-CM | POA: Diagnosis not present

## 2022-02-28 NOTE — Progress Notes (Unsigned)
Office Visit Note  Patient: Leslie Brewer             Date of Birth: 08-26-1961           MRN: [id number]             PCP: Ellyn Hack, MD Referring: Ellyn Hack, MD Visit Date: 03/13/2022 Occupation: @  Subjective:  Medication monitoring   History of Present Illness: Leslie Brewer is a 61 y.o. female with history of seronegative rheumatoid arthritis and osteoarthritis.  She is on Enbrel 50 mg sq injections once weekly.  She is tolerating Enbrel without any side effects or injection site reactions.  She has not had any recent infections and has not missed any doses of Enbrel recently.  She denies any recent rheumatoid arthritis flares.  She states that with overuse activities she will occasionally have increased joint pain and inflammation.  Overall her rheumatoid arthritis has remained well controlled on Enbrel.  She continues to use a cane to assist with ambulation.  She has been exercising on a daily basis typically with the program sit and be fit.  She states both hip replacements are doing well.  She continues to have chronic pain in the left knee replacement.   Activities of Daily Living:  Patient reports morning stiffness for 1 hour.   Patient Denies nocturnal pain.  Difficulty dressing/grooming: Reports Difficulty climbing stairs: Reports Difficulty getting out of chair: Reports Difficulty using hands for taps, buttons, cutlery, and/or writing: Reports  Review of Systems  Constitutional:  Positive for fatigue.  HENT:  Negative for mouth sores, mouth dryness and nose dryness.   Eyes:  Negative for pain, visual disturbance and dryness.  Respiratory:  Negative for cough, hemoptysis, shortness of breath and difficulty breathing.   Cardiovascular:  Positive for swelling in legs/feet. Negative for chest pain, palpitations and hypertension.  Gastrointestinal:  Positive for constipation. Negative for blood in stool and diarrhea.  Endocrine: Positive for heat  intolerance, excessive thirst and increased urination.  Genitourinary:  Positive for difficulty urinating. Negative for painful urination.  Musculoskeletal:  Positive for joint pain, gait problem, joint pain, joint swelling, muscle weakness and morning stiffness. Negative for myalgias, muscle tenderness and myalgias.  Skin:  Positive for rash. Negative for color change, pallor, hair loss, nodules/bumps, skin tightness, ulcers and sensitivity to sunlight.  Allergic/Immunologic: Negative for susceptible to infections.  Neurological:  Positive for numbness. Negative for dizziness, headaches and weakness.  Hematological:  Negative for bruising/bleeding tendency and swollen glands.  Psychiatric/Behavioral:  Positive for sleep disturbance. Negative for depressed mood. The patient is not nervous/anxious.     PMFS History:  Patient Active Problem List   Diagnosis Date Noted   Rheumatoid arthritis involving multiple sites with positive rheumatoid factor (HCC) 02/23/2021   H/O bilateral hip replacements 02/23/2021   History of total left knee replacement 02/23/2021    Past Medical History:  Diagnosis Date   Rheumatoid arthritis (HCC)     Family History  Problem Relation Age of Onset   Cancer Mother    Healthy Daughter    Past Surgical History:  Procedure Laterality Date   APPENDECTOMY     REPLACEMENT TOTAL KNEE Left 2020   TOTAL HIP ARTHROPLASTY Right    TOTAL HIP ARTHROPLASTY Left    TUBAL LIGATION     Social History   Social History Narrative   Not on file   Immunization History  Administered Date(s) Administered   [employer] Vaccination  01/01/2020, 01/29/2020, 08/29/2020, 06/13/2021     Objective: Vital Signs: BP 121/67 (BP Location: Left Arm, Patient Position: Sitting, Cuff Size: Normal)   Pulse 92   Resp 16   Ht 5' 7.5" (1.715 m)   Wt 150 lb 6.4 oz (68.2 kg)   BMI 23.21 kg/m    Physical Exam Vitals and nursing note reviewed.  Constitutional:       Appearance: She is well-developed.  HENT:     Head: Normocephalic and atraumatic.  Eyes:     Conjunctiva/sclera: Conjunctivae normal.  Cardiovascular:     Rate and Rhythm: Normal rate and regular rhythm.     Heart sounds: Normal heart sounds.  Pulmonary:     Effort: Pulmonary effort is normal.     Breath sounds: Normal breath sounds.  Abdominal:     General: Bowel sounds are normal.     Palpations: Abdomen is soft.  Musculoskeletal:     Cervical back: Normal range of motion.  Skin:    General: Skin is warm and dry.     Capillary Refill: Capillary refill takes less than 2 seconds.  Neurological:     Mental Status: She is alert and oriented to person, place, and time.  Psychiatric:        Behavior: Behavior normal.      Musculoskeletal Exam: C-spine has good range of motion with no discomfort.  Shoulder joints, elbow joints, wrist joints, MCPs, PIPs, DIPs have good range of motion with no synovitis.  Complete fist formation bilaterally.  Hip replacements have good range of motion with no groin pain.  Left knee replacement has limited extension with some warmth.  Right knee joint has full range of motion with no warmth or effusion.  Ankle joints have good range of motion with no tenderness or joint swelling.  CDAI Exam: CDAI Score: -- Patient Global: --; Provider Global: -- Swollen: --; Tender: -- Joint Exam 03/13/2022   No joint exam has been documented for this visit   There is currently no information documented on the homunculus. Go to the Rheumatology activity and complete the homunculus joint exam.  Investigation: No additional findings.  Imaging: US ARTERIAL LOWER EXTREMITY DUPLEX BILATERAL  Result Date: 02/23/2022 CLINICAL DATA:  Diminished pulses, poor circulation EXAM: BILATERAL LOWER EXTREMITY ARTERIAL DUPLEX SCAN TECHNIQUE: Gray-scale sonography as well as color Doppler and duplex ultrasound was performed to evaluate the arteries of both lower extremities including  the common, superficial and profunda femoral arteries, popliteal artery and calf arteries. COMPARISON:  None Available. FINDINGS: Right Lower Extremity ABI: 1.2 Inflow: Normal common femoral arterial waveforms and velocities. No evidence of inflow (aortoiliac) disease. Outflow: Normal profunda femoral, superficial femoral and popliteal arterial waveforms and velocities. No focal elevation of the PSV to suggest stenosis. Runoff: Normal posterior and anterior tibial arterial waveforms and velocities. Vessels are patent to the ankle. Left Lower Extremity ABI: 1.2 Inflow: Normal common femoral arterial waveforms and velocities. No evidence of inflow (aortoiliac) disease. Outflow: Normal profunda femoral, superficial femoral and popliteal arterial waveforms and velocities. No focal elevation of the PSV to suggest stenosis. Runoff: Normal posterior and anterior tibial arterial waveforms and velocities. Vessels are patent to the ankle. IMPRESSION: Mild scattered atherosclerotic plaque without evidence of hemodynamically significant stenosis or occlusion. Normal bilateral resting ankle-brachial indices. Signed, Sterling Big, MD, RPVI [facility] Electronically Signed   By: Malachy Moan M.D.   On: 02/23/2022 12:03   US ARTERIAL ABI (SCREENING LOWER EXTREMITY)  Result  Date: 02/23/2022 CLINICAL DATA:  Diminished pulses, poor circulation EXAM: BILATERAL LOWER EXTREMITY ARTERIAL DUPLEX SCAN TECHNIQUE: Gray-scale sonography as well as color Doppler and duplex ultrasound was performed to evaluate the arteries of both lower extremities including the common, superficial and profunda femoral arteries, popliteal artery and calf arteries. COMPARISON:  None Available. FINDINGS: Right Lower Extremity ABI: 1.2 Inflow: Normal common femoral arterial waveforms and velocities. No evidence of inflow (aortoiliac) disease. Outflow: Normal profunda femoral, superficial  femoral and popliteal arterial waveforms and velocities. No focal elevation of the PSV to suggest stenosis. Runoff: Normal posterior and anterior tibial arterial waveforms and velocities. Vessels are patent to the ankle. Left Lower Extremity ABI: 1.2 Inflow: Normal common femoral arterial waveforms and velocities. No evidence of inflow (aortoiliac) disease. Outflow: Normal profunda femoral, superficial femoral and popliteal arterial waveforms and velocities. No focal elevation of the PSV to suggest stenosis. Runoff: Normal posterior and anterior tibial arterial waveforms and velocities. Vessels are patent to the ankle. IMPRESSION: Mild scattered atherosclerotic plaque without evidence of hemodynamically significant stenosis or occlusion. Normal bilateral resting ankle-brachial indices. Signed, Sterling Big, MD, RPVI [facility] Radiology Electronically Signed   By: Malachy Moan M.D.   On: 02/23/2022 12:03    Recent Labs: Lab Results  Component Value Date   WBC 10.6 10/12/2021   HGB 15.0 10/12/2021   PLT 250 10/12/2021   NA 141 10/12/2021   K 4.4 10/12/2021   CL 106 10/12/2021   CO2 31 10/12/2021   GLUCOSE 85 10/12/2021   BUN 9 10/12/2021   CREATININE 0.73 10/12/2021   BILITOT 0.8 10/12/2021   AST 19 10/12/2021   ALT 13 10/12/2021   PROT 6.7 10/12/2021   CALCIUM 9.9 10/12/2021   GFRAA 101 02/23/2021   QFTBGOLDPLUS NEGATIVE 02/23/2021    Speciality Comments: dxd in [place] 17 years ago.  Treated with prednisone many years, Enbrel for 6 years off and on  Procedures:  No procedures performed Allergies: Latex   Assessment / Plan:     Visit Diagnoses: Rheumatoid arthritis of multiple sites with negative rheumatoid factor (HCC) - RF negative, anti-CCP negative, positive ANA. Dxd in [place].  Treated with prednisone for many years and then Enbrel off and on for the last 6 years: She has no joint tenderness or synovitis on examination today.   She has clinically been doing well on Enbrel 50 mg subcu days injections once weekly.  She has been tolerating Enbrel without any side effects or injection site reactions.  She has not had any recent or recurrent infections and has not missed any doses of Enbrel recently.  She experiences occasional arthralgias and joint stiffness especially with overuse activities.  She has no inflammation on examination today.  She has been exercising on a daily basis typically performing "sit and be fit" exercises.  She will remain on Enbrel as monotherapy.  She was advised to notify us if she develops increased joint pain or joint swelling.  She will follow-up in the office in 5 months or sooner if needed.  High risk medication use -  Enbrel 50 mg sq injections once weekly. - Plan: CBC with Differential/Platelet, COMPLETE METABOLIC PANEL WITH GFR, QuantiFERON-TB Gold Plus CBC and CMP updated on 10/12/21. Orders for CBC and CMP released today. Her next lab work will be due in September and every 3 months to monitor for drug toxicity.  TB gold negative on 02/23/21.  Order for TB gold released today. Discussed the importance of holding  enbrel if she develops signs or symptoms of an infection and to resume once the infection has completely cleared.  Discussed the importance of yearly skin exams while on enbrel.   Screening for tuberculosis -Order for TB Gold released today.  Plan: QuantiFERON-TB Gold Plus  Elevated CK: CK was within normal limits 141 on 04/13/2021.  Primary osteoarthritis of both hands - X-rays of both hands were updated on 02/23/2021 which were consistent with rheumatoid arthritis and osteoarthritis overlap.  Discussed the importance of joint protection and muscle strengthening.  Several hand exercises were demonstrated to the patient today.  She was also given a handout of hand exercises to perform.  H/O bilateral hip replacements: Doing well.  She has good range of motion with no groin pain.  Primary  osteoarthritis of right knee: She has good range of motion of the right knee joint.  No warmth or effusion was noted.  History of total left knee replacement: Chronic pain.  Evaluated by orthopedics recently and had updated x-rays.  She continues to use a cane to assist with ambulation.  Primary osteoarthritis of both feet - X-rays of both feet were updated on 02/23/2021 which were consistent with osteoarthritis and rheumatoid arthritis overlap.  She is not experiencing any increased discomfort in her feet at this time.  She has good range of motion of both ankle joints with some tenderness but no synovitis.  She is wearing proper fitting shoes.  Other medical conditions are listed as follows:  Graves' ophthalmopathy  History of COPD  H/O Guillain-Barre syndrome  Smoker   Orders: Orders Placed This Encounter  Procedures   CBC with Differential/Platelet   COMPLETE METABOLIC PANEL WITH GFR   QuantiFERON-TB Gold Plus   Meds ordered this encounter  Medications   etanercept (ENBREL SURECLICK) 50 MG/ML injection    Sig: Inject 50 mg into the skin once a week.    Dispense:  12 mL    Refill:  0      Follow-Up Instructions: Return in about 3 months (around 06/13/2022) for Rheumatoid arthritis, Osteoarthritis.   Gearldine Bienenstockaylor M Ceclia Koker, PA-C  Note - This record has been created using Dragon software.  Chart creation errors have been sought, but may not always  have been located. Such creation errors do not reflect on  the standard of medical care.

## 2022-03-01 ENCOUNTER — Encounter: Payer: Self-pay | Admitting: Physician Assistant

## 2022-03-01 NOTE — Progress Notes (Signed)
   New Patient   Subjective  Leslie Brewer is a 61 y.o. female who presents for the following: New Patient (Initial Visit) (Patient here today for check of multiple lesions on her back and some on her face x 1 year.  Per patient there is bleeding, drainage pain from the lesions. Patient states that she does have a history of eczema x years per patient she's currently using TAC cream (PCP gave). No personal history or family history of atypical moles, melanoma or non mole skin cancer. ).   The following portions of the chart were reviewed this encounter and updated as appropriate:  Tobacco  Allergies  Meds  Problems  Med Hx  Surg Hx  Fam Hx      Objective  Well appearing patient in no apparent distress; mood and affect are within normal limits.  All skin waist up examined.  Right Buccal Cheek, Right Upper Back Small nodule   Assessment & Plan  Epidermal cyst (2) Right Upper Back; Right Buccal Cheek  Will call if needed.      I, Kazoua Gossen, PA-C, have reviewed all documentation's for this visit.  The documentation on 03/01/22 for the exam, diagnosis, procedures and orders are all accurate and complete.

## 2022-03-06 ENCOUNTER — Other Ambulatory Visit: Payer: Self-pay | Admitting: Nurse Practitioner

## 2022-03-06 ENCOUNTER — Other Ambulatory Visit (HOSPITAL_COMMUNITY)
Admission: RE | Admit: 2022-03-06 | Discharge: 2022-03-06 | Disposition: A | Discharge status: Home / Self Care | Payer: Medicare PPO | Source: Ambulatory Visit | Attending: Nurse Practitioner | Admitting: Nurse Practitioner

## 2022-03-06 DIAGNOSIS — Z1151 Encounter for screening for human papillomavirus (HPV): Secondary | ICD-10-CM | POA: Insufficient documentation

## 2022-03-06 DIAGNOSIS — Z124 Encounter for screening for malignant neoplasm of cervix: Secondary | ICD-10-CM | POA: Insufficient documentation

## 2022-03-06 DIAGNOSIS — Z01419 Encounter for gynecological examination (general) (routine) without abnormal findings: Secondary | ICD-10-CM | POA: Insufficient documentation

## 2022-03-08 LAB — CYTOLOGY - PAP
Comment: NEGATIVE
Diagnosis: NEGATIVE
High risk HPV: NEGATIVE

## 2022-03-13 ENCOUNTER — Encounter: Payer: Self-pay | Admitting: Physician Assistant

## 2022-03-13 ENCOUNTER — Ambulatory Visit (INDEPENDENT_AMBULATORY_CARE_PROVIDER_SITE_OTHER): Payer: Medicare PPO | Admitting: Physician Assistant

## 2022-03-13 VITALS — BP 121/67 | HR 92 | Resp 16 | Ht 67.5 in | Wt 150.4 lb

## 2022-03-13 DIAGNOSIS — Z79899 Other long term (current) drug therapy: Secondary | ICD-10-CM

## 2022-03-13 DIAGNOSIS — M0609 Rheumatoid arthritis without rheumatoid factor, multiple sites: Secondary | ICD-10-CM | POA: Diagnosis not present

## 2022-03-13 DIAGNOSIS — M19072 Primary osteoarthritis, left ankle and foot: Secondary | ICD-10-CM

## 2022-03-13 DIAGNOSIS — Z96643 Presence of artificial hip joint, bilateral: Secondary | ICD-10-CM

## 2022-03-13 DIAGNOSIS — Z8709 Personal history of other diseases of the respiratory system: Secondary | ICD-10-CM

## 2022-03-13 DIAGNOSIS — M19042 Primary osteoarthritis, left hand: Secondary | ICD-10-CM

## 2022-03-13 DIAGNOSIS — M19041 Primary osteoarthritis, right hand: Secondary | ICD-10-CM | POA: Diagnosis not present

## 2022-03-13 DIAGNOSIS — F17200 Nicotine dependence, unspecified, uncomplicated: Secondary | ICD-10-CM

## 2022-03-13 DIAGNOSIS — Z8669 Personal history of other diseases of the nervous system and sense organs: Secondary | ICD-10-CM

## 2022-03-13 DIAGNOSIS — R748 Abnormal levels of other serum enzymes: Secondary | ICD-10-CM

## 2022-03-13 DIAGNOSIS — M1711 Unilateral primary osteoarthritis, right knee: Secondary | ICD-10-CM

## 2022-03-13 DIAGNOSIS — Z111 Encounter for screening for respiratory tuberculosis: Secondary | ICD-10-CM

## 2022-03-13 DIAGNOSIS — E0500 Thyrotoxicosis with diffuse goiter without thyrotoxic crisis or storm: Secondary | ICD-10-CM

## 2022-03-13 DIAGNOSIS — Z96652 Presence of left artificial knee joint: Secondary | ICD-10-CM

## 2022-03-13 DIAGNOSIS — M19071 Primary osteoarthritis, right ankle and foot: Secondary | ICD-10-CM

## 2022-03-13 MED ORDER — ENBREL SURECLICK 50 MG/ML ~~LOC~~ SOAJ
50.0000 mg | SUBCUTANEOUS | 0 refills | Status: DC
Start: 2022-03-13 — End: 2022-03-15

## 2022-03-13 NOTE — Patient Instructions (Addendum)
Standing Labs We placed an order today for your standing lab work.   Please have your standing labs drawn in September and every 3 months   If possible, please have your labs drawn 2 weeks prior to your appointment so that the provider can discuss your results at your appointment.  Please note that you may see your imaging and lab results in MyChart before we have reviewed them. We may be awaiting multiple results to interpret others before contacting you. Please allow our office up to 72 hours to thoroughly review all of the results before contacting the office for clarification of your results.  We have open lab daily: Monday through Thursday from 1:30-4:30 PM and Friday from 1:30-4:00 PM at the office of Dr. Pollyann Savoy, [facility].   Please be advised, all patients with office appointments requiring lab work will take precedent over walk-in lab work.  If possible, please come for your lab work on Monday and Friday afternoons, as you may experience shorter wait times. The office is located at [address], [address], [place], [place] [address] No appointment is necessary.   Labs are drawn by [facility]. Please bring your co-pay at the time of your lab draw.  You may receive a bill from [facility] for your lab work.  Please note if you are on Hydroxychloroquine and and an order has been placed for a Hydroxychloroquine level, you will need to have it drawn 4 hours or more after your last dose.  If you wish to have your labs drawn at another location, please call the office 24 hours in advance to send orders.  If you have any questions regarding directions or hours of operation,  please call [phone].   As a reminder, please drink plenty of water prior to coming for your lab work. Thanks!    Hand Exercises Hand exercises can be helpful for almost anyone. These exercises can strengthen the hands, improve flexibility and movement, and increase blood flow to the hands.  These results can make work and daily tasks easier. Hand exercises can be especially helpful for people who have joint pain from arthritis or have nerve damage from overuse (carpal tunnel syndrome). These exercises can also help people who have injured a hand. Exercises Most of these hand exercises are gentle stretching and motion exercises. It is usually safe to do them often throughout the day. Warming up your hands before exercise may help to reduce stiffness. You can do this with gentle massage or by placing your hands in warm water for 10-15 minutes. It is normal to feel some stretching, pulling, tightness, or mild discomfort as you begin new exercises. This will gradually improve. Stop an exercise right away if you feel sudden, severe pain or your pain gets worse. Ask your health care provider which exercises are best for you. Knuckle bend or "claw" fist  Stand or sit with your arm, hand, and all five fingers pointed straight up. Make sure to keep your wrist straight during the exercise. Gently bend your fingers down toward your palm until the tips of your fingers are touching the top of your palm. Keep your big knuckle straight and just bend the small knuckles in your fingers. Hold this position for __________ seconds. Straighten (extend) your fingers back to the starting position. Repeat this exercise 5-10 times with each hand. Full finger fist  Stand or sit with your arm, hand, and all five fingers pointed straight up. Make sure to keep your wrist straight  during the exercise. Gently bend your fingers into your palm until the tips of your fingers are touching the middle of your palm. Hold this position for __________ seconds. Extend your fingers back to the starting position, stretching every joint fully. Repeat this exercise 5-10 times with each hand. Straight fist Stand or sit with your arm, hand, and all five fingers pointed straight up. Make sure to keep your wrist straight during  the exercise. Gently bend your fingers at the big knuckle, where your fingers meet your hand, and the middle knuckle. Keep the knuckle at the tips of your fingers straight and try to touch the bottom of your palm. Hold this position for __________ seconds. Extend your fingers back to the starting position, stretching every joint fully. Repeat this exercise 5-10 times with each hand. Tabletop  Stand or sit with your arm, hand, and all five fingers pointed straight up. Make sure to keep your wrist straight during the exercise. Gently bend your fingers at the big knuckle, where your fingers meet your hand, as far down as you can while keeping the small knuckles in your fingers straight. Think of forming a tabletop with your fingers. Hold this position for __________ seconds. Extend your fingers back to the starting position, stretching every joint fully. Repeat this exercise 5-10 times with each hand. Finger spread  Place your hand flat on a table with your palm facing down. Make sure your wrist stays straight as you do this exercise. Spread your fingers and thumb apart from each other as far as you can until you feel a gentle stretch. Hold this position for __________ seconds. Bring your fingers and thumb tight together again. Hold this position for __________ seconds. Repeat this exercise 5-10 times with each hand. Making circles  Stand or sit with your arm, hand, and all five fingers pointed straight up. Make sure to keep your wrist straight during the exercise. Make a circle by touching the tip of your thumb to the tip of your index finger. Hold for __________ seconds. Then open your hand wide. Repeat this motion with your thumb and each finger on your hand. Repeat this exercise 5-10 times with each hand. Thumb motion  Sit with your forearm resting on a table and your wrist straight. Your thumb should be facing up toward the ceiling. Keep your fingers relaxed as you move your thumb. Lift  your thumb up as high as you can toward the ceiling. Hold for __________ seconds. Bend your thumb across your palm as far as you can, reaching the tip of your thumb for the small finger (pinkie) side of your palm. Hold for __________ seconds. Repeat this exercise 5-10 times with each hand. Grip strengthening  Hold a stress ball or other soft ball in the middle of your hand. Slowly increase the pressure, squeezing the ball as much as you can without causing pain. Think of bringing the tips of your fingers into the middle of your palm. All of your finger joints should bend when doing this exercise. Hold your squeeze for __________ seconds, then relax. Repeat this exercise 5-10 times with each hand. Contact a health care provider if: Your hand pain or discomfort gets much worse when you do an exercise. Your hand pain or discomfort does not improve within 2 hours after you exercise. If you have any of these problems, stop doing these exercises right away. Do not do them again unless your health care provider says that you can. Get help right away  if: You develop sudden, severe hand pain or swelling. If this happens, stop doing these exercises right away. Do not do them again unless your health care provider says that you can. This information is not intended to replace advice given to you by your health care provider. Make sure you discuss any questions you have with your health care provider. Document Revised: 12/29/2020 Document Reviewed: 12/29/2020 Elsevier Patient Education  2023 [employer].

## 2022-03-14 NOTE — Progress Notes (Signed)
CMP WNL.  WBC count is borderline elevated. Absolute lymphocyte count is elevated but stable. We will continue to monitor.

## 2022-03-15 ENCOUNTER — Telehealth: Payer: Self-pay | Admitting: Rheumatology

## 2022-03-15 MED ORDER — ENBREL SURECLICK 50 MG/ML ~~LOC~~ SOAJ
50.0000 mg | SUBCUTANEOUS | 0 refills | Status: DC
Start: 2022-03-15 — End: 2022-05-31

## 2022-03-15 NOTE — Telephone Encounter (Signed)
Patient called checking the status of her Enbrel medication.  Patient states she called [employer] and they haven't received the prescription.  Patient requested a return call.

## 2022-03-15 NOTE — Telephone Encounter (Signed)
Prescription was sent to [facility] at the time of her appointment. Prescription should have went to [facility]. Prescription sent to correct pharmacy. Patient advised. Advised patient to give them 24-48 hours to process the prescription. Patient advised if she has trouble getting her medication prior to her next injection to call the office we may be able to provide a sample. Patient expressed understanding.

## 2022-03-17 LAB — COMPLETE METABOLIC PANEL WITH GFR
AG Ratio: 2 (calc) (ref 1.0–2.5)
ALT: 10 U/L (ref 6–29)
AST: 19 U/L (ref 10–35)
Albumin: 4.7 g/dL (ref 3.6–5.1)
Alkaline phosphatase (APISO): 82 U/L (ref 37–153)
BUN: 8 mg/dL (ref 7–25)
CO2: 26 mmol/L (ref 20–32)
Calcium: 10 mg/dL (ref 8.6–10.4)
Chloride: 106 mmol/L (ref 98–110)
Creat: 0.73 mg/dL (ref 0.50–1.05)
Globulin: 2.4 g/dL (calc) (ref 1.9–3.7)
Glucose, Bld: 83 mg/dL (ref 65–99)
Potassium: 4.5 mmol/L (ref 3.5–5.3)
Sodium: 141 mmol/L (ref 135–146)
Total Bilirubin: 0.6 mg/dL (ref 0.2–1.2)
Total Protein: 7.1 g/dL (ref 6.1–8.1)
eGFR: 93 mL/min/{1.73_m2} (ref >=60)

## 2022-03-17 LAB — QUANTIFERON-TB GOLD PLUS
Mitogen-NIL: 9.34 IU/mL
NIL: 0.02 IU/mL
QuantiFERON-TB Gold Plus: NEGATIVE
TB1-NIL: 0 IU/mL
TB2-NIL: 0 IU/mL

## 2022-03-17 LAB — CBC WITH DIFFERENTIAL/PLATELET
Absolute Monocytes: 799 cells/uL (ref 200–950)
Basophils Absolute: 48 cells/uL (ref 0–200)
Basophils Relative: 0.4 %
Eosinophils Absolute: 121 cells/uL (ref 15–500)
Eosinophils Relative: 1 %
HCT: 46.6 % — ABNORMAL HIGH (ref 35.0–45.0)
Hemoglobin: 15.4 g/dL (ref 11.7–15.5)
Lymphs Abs: 4259 cells/uL — ABNORMAL HIGH (ref 850–3900)
MCH: 31.2 pg (ref 27.0–33.0)
MCHC: 33 g/dL (ref 32.0–36.0)
MCV: 94.5 fL (ref 80.0–100.0)
MPV: 11 fL (ref 7.5–12.5)
Monocytes Relative: 6.6 %
Neutro Abs: 6873 cells/uL (ref 1500–7800)
Neutrophils Relative %: 56.8 %
Platelets: 220 10*3/uL (ref 140–400)
RBC: 4.93 10*6/uL (ref 3.80–5.10)
RDW: 12.5 % (ref 11.0–15.0)
Total Lymphocyte: 35.2 %
WBC: 12.1 10*3/uL — ABNORMAL HIGH (ref 3.8–10.8)

## 2022-04-25 IMAGING — NM NM BONE 3 PHASE
10 series · 20 of 20 positions shown · non-contrast
Comparison: None

CLINICAL DATA: LEFT knee pain central placement.

EXAM:
NUCLEAR MEDICINE 3-PHASE BONE SCAN
TECHNIQUE: Radionuclide angiographic images, immediate static blood pool
images, and 3-hour delayed static images were obtained of the after
intravenous injection of radiopharmaceutical.
RADIOPHARMACEUTICALS:  21.5 mCi Sc-VVm MDP IV

[Series 1: flow · 2.07mm/px · 6 of 48 frames shown (1 of 2)]
[frame 5/48]
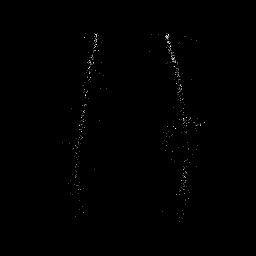
[frame 13/48  full-range]
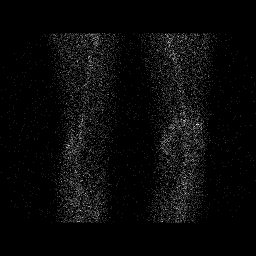
[frame 21/48  full-range]
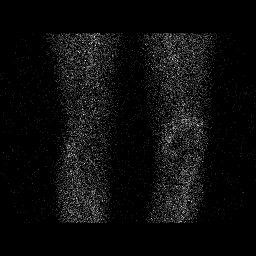
[frame 29/48  full-range]
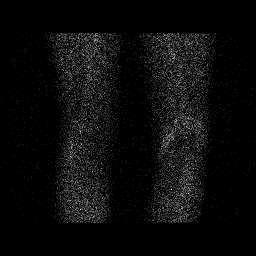
[frame 37/48  full-range]
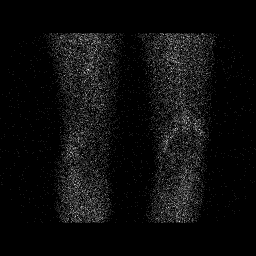
[frame 45/48  full-range]
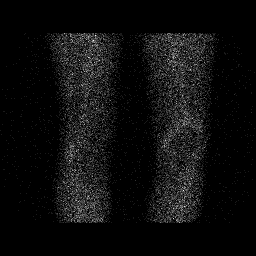

[Series 1: flow · 2.07mm/px · 6 of 48 frames shown (2 of 2)]
[frame 5/48]
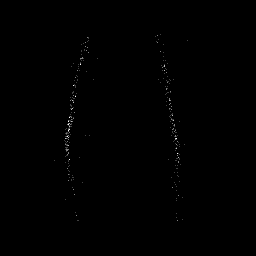
[frame 13/48  full-range]
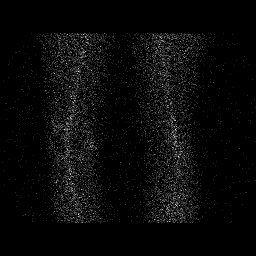
[frame 21/48  full-range]
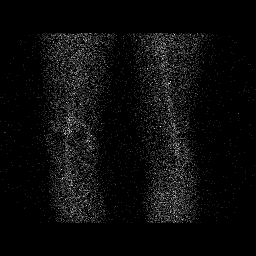
[frame 29/48  full-range]
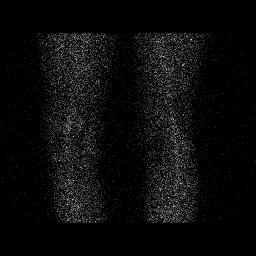
[frame 37/48  full-range]
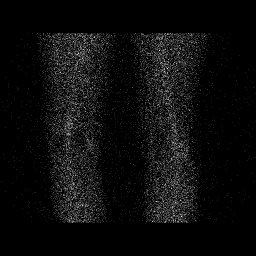
[frame 45/48  full-range]
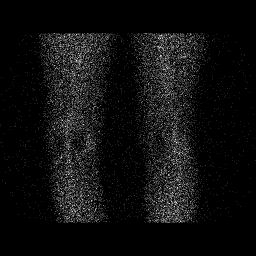

[Series 2: blood pool · 2.07mm/px · 1 of 1 slices shown (1 of 2)]
[im 1/1  full-range]
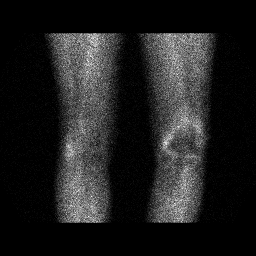

[Series 2: blood pool · 2.07mm/px · 1 of 1 slices shown (2 of 2)]
[im 1/1  full-range]
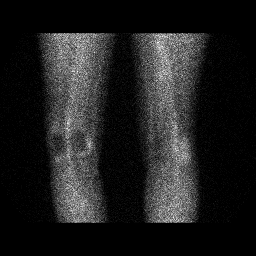

[Series 3: lat bp · 2.07mm/px · 1 of 1 slices shown (1 of 2)]
[im 1/1  full-range]
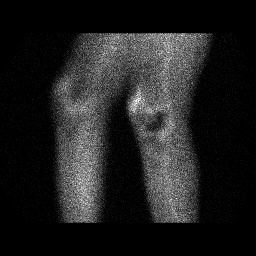

[Series 3: lat bp · 2.07mm/px · 1 of 1 slices shown (2 of 2)]
[im 1/1  full-range]
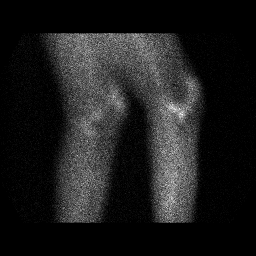

[Series 4: delay · delayed · 2.07mm/px · 1 of 1 slices shown (1 of 4)]
[im 1/1]
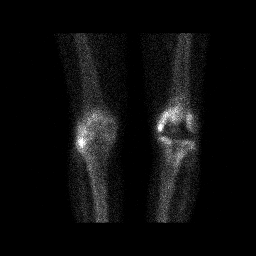

[Series 4: delay · delayed · 2.07mm/px · 1 of 1 slices shown (2 of 4)]
[im 1/1]
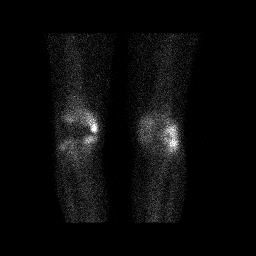

[Series 5: delay · delayed · 2.07mm/px · 1 of 1 slices shown (3 of 4)]
[im 1/1  full-range]
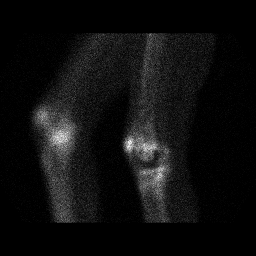

[Series 5: delay · delayed · 2.07mm/px · 1 of 1 slices shown (4 of 4)]
[im 1/1]
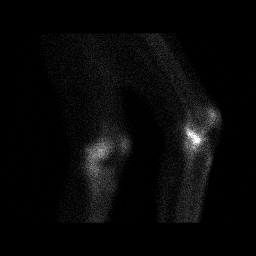

[20 of 20 positions shown; findings below may reference images not displayed]

FINDINGS: Vascular phase: Mild increased arterial blood flow to the LEFT knee
along the medial margin superior to the joint space.

Blood pool phase: Mild increased blood pool activity in theLEFT knee
localizing to the joint capsule above the knee joint.

Delayed phase: Mild activity in the LEFT knee associated with the
tibial plateau and femoral condyles. More focal activity in the
medial femoral condyle

Evidence of degenerative change in lateral compartment of the RIGHT
knee.
IMPRESSION: LEFT knee shows mild increased activity on all 3 phases of the bone
scan.

Most intense activity (albeit mild) is in the medial compartment
along the LEFT femoral condyle.

## 2022-05-18 NOTE — Progress Notes (Signed)
Office Visit Note  Patient: Leslie Brewer             Date of Birth: 28-Dec-1960           MRN: [id number]             PCP: Ellyn Hack, MD Referring: Ellyn Hack, MD Visit Date: 05/31/2022 Occupation: @GUAROCC @  Subjective:  Medication monitoring   History of Present Illness: Leslie Brewer is a 61 y.o. female with history of seronegative rheumatoid arthritis and osteoarthritis.  Patient is currently on Enbrel 50 mg subcutaneous injections once weekly.  She is tolerating Enbrel without any side effects or injection site reactions.  She has not had any signs or symptoms of a rheumatoid arthritis flare.  She denies any joint swelling currently.  Her morning stiffness continues to last for about 2 hours daily.  She has occasional discomfort in her shoulders especially at night when laying on her sides.  She continues to have chronic pain in the left knee replacement.  She states both hip replacements are doing well.  She has been using a cane to assist with ambulation.  She denies any recent falls.  She is scheduled for an updated bone density on 07/31/2022.  She continues to take vitamin D 2000 units daily.  She denies any new medical conditions.  She has not any recent or recurrent infections.     Activities of Daily Living:  Patient reports morning stiffness for 2 hours.   Patient Reports nocturnal pain.  Difficulty dressing/grooming: Reports Difficulty climbing stairs: Reports Difficulty getting out of chair: Reports Difficulty using hands for taps, buttons, cutlery, and/or writing: Reports  Review of Systems  Constitutional:  Negative for fatigue.  HENT:  Negative for mouth sores, mouth dryness and nose dryness.   Eyes:  Positive for dryness. Negative for pain and visual disturbance.  Respiratory:  Negative for cough, hemoptysis and difficulty breathing.   Cardiovascular:  Negative for chest pain, palpitations, hypertension and swelling in legs/feet.  Gastrointestinal:   Positive for constipation. Negative for blood in stool and diarrhea.  Endocrine: Negative for increased urination.  Genitourinary:  Negative for painful urination and involuntary urination.  Musculoskeletal:  Positive for joint pain, joint pain and morning stiffness. Negative for gait problem, joint swelling, myalgias, muscle weakness, muscle tenderness and myalgias.  Skin:  Negative for color change, pallor, rash, hair loss, nodules/bumps, skin tightness, ulcers and sensitivity to sunlight.  Allergic/Immunologic: Negative for susceptible to infections.  Neurological:  Negative for numbness, headaches and weakness.  Hematological:  Negative for swollen glands.  Psychiatric/Behavioral:  Positive for depressed mood. Negative for sleep disturbance. The patient is nervous/anxious.     PMFS History:  Patient Active Problem List   Diagnosis Date Noted   Rheumatoid arthritis involving multiple sites with positive rheumatoid factor (HCC) 02/23/2021   H/O bilateral hip replacements 02/23/2021   History of total left knee replacement 02/23/2021    Past Medical History:  Diagnosis Date   Rheumatoid arthritis (HCC)     Family History  Problem Relation Age of Onset   Cancer Mother    Healthy Daughter    Past Surgical History:  Procedure Laterality Date   APPENDECTOMY     REPLACEMENT TOTAL KNEE Left 2020   TOTAL HIP ARTHROPLASTY Right    TOTAL HIP ARTHROPLASTY Left    TUBAL LIGATION     Social History   Social History Narrative   Not on file   Immunization History  Administered Date(s) Administered   Moderna Sars-Covid-2 Vaccination 01/01/2020, 01/29/2020, 08/29/2020, 06/13/2021     Objective: Vital Signs: BP 93/63 (BP Location: Left Arm, Patient Position: Sitting, Cuff Size: Normal)   Pulse 96   Resp 17   Ht 5\' 7"  (1.702 m)   Wt 149 lb 6.4 oz (67.8 kg)   BMI 23.40 kg/m    Physical Exam Vitals and nursing note reviewed.  Constitutional:      Appearance: She is  well-developed.  HENT:     Head: Normocephalic and atraumatic.  Eyes:     Conjunctiva/sclera: Conjunctivae normal.  Cardiovascular:     Rate and Rhythm: Normal rate and regular rhythm.     Heart sounds: Normal heart sounds.  Pulmonary:     Effort: Pulmonary effort is normal.     Breath sounds: Normal breath sounds.  Abdominal:     General: Bowel sounds are normal.     Palpations: Abdomen is soft.  Musculoskeletal:     Cervical back: Normal range of motion.  Skin:    General: Skin is warm and dry.     Capillary Refill: Capillary refill takes less than 2 seconds.  Neurological:     Mental Status: She is alert and oriented to person, place, and time.  Psychiatric:        Behavior: Behavior normal.      Musculoskeletal Exam: C-spine has good range of motion with no discomfort.  No midline spinal tenderness or SI joint tenderness.  Shoulder joints have good range of motion with minimal discomfort.  Elbow joints, wrist joints, MCPs, PIPs, DIPs have good range of motion with no synovitis.  Complete fist formation noted bilaterally.  Hip replacements have good range of motion with no groin pain currently.  Left knee replacement has limited extension with discomfort and warmth.  Right knee joint has good range of motion with no warmth or effusion.  Ankle joints have good range of motion with no tenderness or joint swelling.  CDAI Exam: CDAI Score: 1  Patient Global: 5 mm; Provider Global: 5 mm Swollen: 0 ; Tender: 0  Joint Exam 05/31/2022   No joint exam has been documented for this visit   There is currently no information documented on the homunculus. Go to the Rheumatology activity and complete the homunculus joint exam.  Investigation: No additional findings.  Imaging: No results found.  Recent Labs: Lab Results  Component Value Date   WBC 12.1 (H) 03/13/2022   HGB 15.4 03/13/2022   PLT 220 03/13/2022   NA 141 03/13/2022   K 4.5 03/13/2022   CL 106 03/13/2022   CO2 26  03/13/2022   GLUCOSE 83 03/13/2022   BUN 8 03/13/2022   CREATININE 0.73 03/13/2022   BILITOT 0.6 03/13/2022   AST 19 03/13/2022   ALT 10 03/13/2022   PROT 7.1 03/13/2022   CALCIUM 10.0 03/13/2022   GFRAA 101 02/23/2021   QFTBGOLDPLUS NEGATIVE 03/13/2022    Speciality Comments: dxd in [place] 17 years ago.  Treated with prednisone many years, Enbrel for 6 years off and on  Procedures:  No procedures performed Allergies: Latex   Assessment / Plan:     Visit Diagnoses: Rheumatoid arthritis of multiple sites with negative rheumatoid factor (HCC) -  RF negative, anti-CCP negative, positive ANA. Dxd in [place].  Treated with prednisone for many years and then Enbrel off and on for the last 6 years: She has no joint tenderness or synovitis on examination today.  She has not had any  signs or symptoms of a rheumatoid arthritis flare.  She has clinically been doing well on Enbrel 50 mg sq injections every week.  She continues to tolerate Enbrel without any side effects or injection site reactions.  She continues to find Enbrel to be effective at managing her rheumatoid arthritis.  No medication changes will be made at this time.  She was advised to notify us if she develops increased joint pain or joint swelling.  She will follow-up in the office in 3 months or sooner if needed.  Association of heart disease with rheumatoid arthritis was discussed. Need to monitor blood pressure, cholesterol, and to exercise 30-60 minutes on daily basis was discussed.   High risk medication use - Enbrel 50 mg sq injections once weekly. - Plan: COMPLETE METABOLIC PANEL WITH GFR, CBC with Differential/Platelet CBC and CMP were drawn on 03/13/2022.  Orders for CBC and CMP were released today.  Her next lab work will be due in December and every 3 months to monitor for drug toxicity.  Standing orders for CBC and CMP remain in place. TB Gold negative on 03/13/2022 and will continue to be monitored yearly. She has not had any recent  or recurrent infections.  Discussed the importance of holding Enbrel if she develops signs or symptoms of an infection and to resume once the infection is completely cleared. Discussed the importance of yearly skin cancer screening while on Enbrel. No new medical conditions.  Elevated CK: CK was 141 on 04/13/2021.  No muscle weakness.  Primary osteoarthritis of both hands: She has some PIP and DIP thickening consistent with osteoarthritis of both hands.  No tenderness or inflammation noted on examination today.  Complete fist formation noted bilaterally. Discussed the importance of joint protection and muscle strengthening.   H/O bilateral hip replacements: Doing well.  Good range of motion with no groin pain currently.  Using a cane to assist with ambulation.  Primary osteoarthritis of right knee: She has good range of motion of the right knee joint on examination today.  No warmth or effusion was noted.  History of total left knee replacement: Chronic pain.  Limited extension and warmth.  Using a cane to assist with ambulation.  No recent falls.  Primary osteoarthritis of both feet: She is not experiencing any increased discomfort in her feet at this time.  She is wearing proper fitting shoes.  Other medical conditions are listed as follows:   Graves' ophthalmopathy  History of COPD  H/O Guillain-Barre syndrome  Smoker  Orders: Orders Placed This Encounter  Procedures   COMPLETE METABOLIC PANEL WITH GFR   CBC with Differential/Platelet   Meds ordered this encounter  Medications   etanercept (ENBREL SURECLICK) 50 MG/ML injection    Sig: Inject 50 mg into the skin once a week.    Dispense:  12 mL    Refill:  0     Follow-Up Instructions: Return in about 3 months (around 08/30/2022) for Rheumatoid arthritis.   Gearldine Bienenstock, PA-C  Note - This record has been created using Dragon software.  Chart creation errors have been sought, but may not always  have been located. Such  creation errors do not reflect on  the standard of medical care.

## 2022-05-31 ENCOUNTER — Ambulatory Visit: Payer: Medicare PPO | Attending: Physician Assistant | Admitting: Physician Assistant

## 2022-05-31 ENCOUNTER — Encounter: Payer: Self-pay | Admitting: Physician Assistant

## 2022-05-31 VITALS — BP 93/63 | HR 96 | Resp 17 | Ht 67.0 in | Wt 149.4 lb

## 2022-05-31 DIAGNOSIS — M19042 Primary osteoarthritis, left hand: Secondary | ICD-10-CM

## 2022-05-31 DIAGNOSIS — M19041 Primary osteoarthritis, right hand: Secondary | ICD-10-CM | POA: Diagnosis not present

## 2022-05-31 DIAGNOSIS — M19071 Primary osteoarthritis, right ankle and foot: Secondary | ICD-10-CM

## 2022-05-31 DIAGNOSIS — F17200 Nicotine dependence, unspecified, uncomplicated: Secondary | ICD-10-CM

## 2022-05-31 DIAGNOSIS — M1711 Unilateral primary osteoarthritis, right knee: Secondary | ICD-10-CM

## 2022-05-31 DIAGNOSIS — M0609 Rheumatoid arthritis without rheumatoid factor, multiple sites: Secondary | ICD-10-CM

## 2022-05-31 DIAGNOSIS — Z8669 Personal history of other diseases of the nervous system and sense organs: Secondary | ICD-10-CM

## 2022-05-31 DIAGNOSIS — Z96643 Presence of artificial hip joint, bilateral: Secondary | ICD-10-CM

## 2022-05-31 DIAGNOSIS — R748 Abnormal levels of other serum enzymes: Secondary | ICD-10-CM

## 2022-05-31 DIAGNOSIS — Z79899 Other long term (current) drug therapy: Secondary | ICD-10-CM | POA: Diagnosis not present

## 2022-05-31 DIAGNOSIS — Z96652 Presence of left artificial knee joint: Secondary | ICD-10-CM

## 2022-05-31 DIAGNOSIS — E0500 Thyrotoxicosis with diffuse goiter without thyrotoxic crisis or storm: Secondary | ICD-10-CM

## 2022-05-31 DIAGNOSIS — M19072 Primary osteoarthritis, left ankle and foot: Secondary | ICD-10-CM

## 2022-05-31 DIAGNOSIS — Z8709 Personal history of other diseases of the respiratory system: Secondary | ICD-10-CM

## 2022-05-31 MED ORDER — ENBREL SURECLICK 50 MG/ML ~~LOC~~ SOAJ
50.0000 mg | SUBCUTANEOUS | 0 refills | Status: DC
Start: 2022-05-31 — End: 2022-08-23

## 2022-05-31 NOTE — Patient Instructions (Addendum)
Standing Labs We placed an order today for your standing lab work.   Please have your standing labs drawn in December and every 3 months   If possible, please have your labs drawn 2 weeks prior to your appointment so that the provider can discuss your results at your appointment.  Please note that you may see your imaging and lab results in MyChart before we have reviewed them. We may be awaiting multiple results to interpret others before contacting you. Please allow our office up to 72 hours to thoroughly review all of the results before contacting the office for clarification of your results.  We currently have open lab daily: Monday through Thursday from 1:30 PM-4:30 PM and Friday from 1:30 PM- 4:00 PM If possible, please come for your lab work on Monday, Thursday or Friday afternoons, as you may experience shorter wait times.   Effective July 25, 2022 the new lab hours will change to: Monday through Thursday from 1:30 PM-5:00 PM and Friday from 8:30 AM-12:00 PM If possible, please come for your lab work on Monday and Thursday afternoons, as you may experience shorter wait times.  Please be advised, all patients with office appointments requiring lab work will take precedent over walk-in lab work.    The office is located at [address], [address], [place], [place] [address] No appointment is necessary.   Labs are drawn by [facility]. Please bring your co-pay at the time of your lab draw.  You may receive a bill from [facility] for your lab work.  Please note if you are on Hydroxychloroquine and and an order has been placed for a Hydroxychloroquine level, you will need to have it drawn 4 hours or more after your last dose.  If you wish to have your labs drawn at another location, please call the office 24 hours in advance to send orders.  If you have any questions regarding directions or hours of operation,  please call [phone].   As a reminder, please drink plenty of water prior  to coming for your lab work. Thanks!   If you have signs or symptoms of an infection or start antibiotics: First, call your PCP for workup of your infection. Hold your medication through the infection, until you complete your antibiotics, and until symptoms resolve if you take the following: Injectable medication (Actemra, Benlysta, Cimzia, Cosentyx, Enbrel, Humira, Kevzara, Orencia, Remicade, Simponi, Stelara, Taltz, Tremfya) Methotrexate Leflunomide (Arava) Mycophenolate (Cellcept) Xeljanz, Olumiant, or Rinvoq Vaccines You are taking a medication(s) that can suppress your immune system.  The following immunizations are recommended: Flu annually Covid-19  Td/Tdap (tetanus, diphtheria, pertussis) every 10 years Pneumonia (Prevnar 15 then Pneumovax 23 at least 1 year apart.  Alternatively, can take Prevnar 20 without needing additional dose) Shingrix: 2 doses from 4 weeks to 6 months apart  Please check with your PCP to make sure you are up to date.   

## 2022-06-01 ENCOUNTER — Telehealth: Payer: Self-pay | Admitting: Hematology

## 2022-06-01 ENCOUNTER — Telehealth: Payer: Self-pay | Admitting: *Deleted

## 2022-06-01 DIAGNOSIS — D72820 Lymphocytosis (symptomatic): Secondary | ICD-10-CM

## 2022-06-01 LAB — CBC WITH DIFFERENTIAL/PLATELET
Absolute Monocytes: 939 cells/uL (ref 200–950)
Basophils Absolute: 71 cells/uL (ref 0–200)
Basophils Relative: 0.7 %
Eosinophils Absolute: 152 cells/uL (ref 15–500)
Eosinophils Relative: 1.5 %
HCT: 46.5 % — ABNORMAL HIGH (ref 35.0–45.0)
Hemoglobin: 15.7 g/dL — ABNORMAL HIGH (ref 11.7–15.5)
Lymphs Abs: 4323 cells/uL — ABNORMAL HIGH (ref 850–3900)
MCH: 31.3 pg (ref 27.0–33.0)
MCHC: 33.8 g/dL (ref 32.0–36.0)
MCV: 92.6 fL (ref 80.0–100.0)
MPV: 10.4 fL (ref 7.5–12.5)
Monocytes Relative: 9.3 %
Neutro Abs: 4616 cells/uL (ref 1500–7800)
Neutrophils Relative %: 45.7 %
Platelets: 231 10*3/uL (ref 140–400)
RBC: 5.02 10*6/uL (ref 3.80–5.10)
RDW: 13 % (ref 11.0–15.0)
Total Lymphocyte: 42.8 %
WBC: 10.1 10*3/uL (ref 3.8–10.8)

## 2022-06-01 LAB — COMPLETE METABOLIC PANEL WITH GFR
AG Ratio: 1.9 (calc) (ref 1.0–2.5)
ALT: 15 U/L (ref 6–29)
AST: 21 U/L (ref 10–35)
Albumin: 4.8 g/dL (ref 3.6–5.1)
Alkaline phosphatase (APISO): 81 U/L (ref 37–153)
BUN: 10 mg/dL (ref 7–25)
CO2: 30 mmol/L (ref 20–32)
Calcium: 9.7 mg/dL (ref 8.6–10.4)
Chloride: 106 mmol/L (ref 98–110)
Creat: 0.8 mg/dL (ref 0.50–1.05)
Globulin: 2.5 g/dL (calc) (ref 1.9–3.7)
Glucose, Bld: 75 mg/dL (ref 65–99)
Potassium: 4.6 mmol/L (ref 3.5–5.3)
Sodium: 143 mmol/L (ref 135–146)
Total Bilirubin: 0.5 mg/dL (ref 0.2–1.2)
Total Protein: 7.3 g/dL (ref 6.1–8.1)
eGFR: 83 mL/min/{1.73_m2} (ref >=60)

## 2022-06-01 NOTE — Telephone Encounter (Signed)
Scheduled appt per 9/8 referral. Pt is aware of appt date and time. Pt is aware to arrive 15 mins prior to appt time and to bring and updated insurance card. Pt is aware of appt location.   

## 2022-06-01 NOTE — Progress Notes (Signed)
CMP WNL. CBC stable. Absolute lymphocyte count remains elevated and continues to trend up gradually.  Total WBC count is WNL.   Patient has not had any recent infections and has no active inflammation from RA at this time.   Please place a referral to hematology for further evaluation.

## 2022-06-01 NOTE — Telephone Encounter (Signed)
-----   Message from Gearldine Bienenstock, PA-C sent at 06/01/2022  8:11 AM EDT ----- CMP WNL. CBC stable. Absolute lymphocyte count remains elevated and continues to trend up gradually.  Total WBC count is WNL.   Patient has not had any recent infections and has no active inflammation from RA at this time.   Please place a referral to hematology for further evaluation.

## 2022-06-27 ENCOUNTER — Other Ambulatory Visit: Payer: Self-pay

## 2022-06-27 ENCOUNTER — Inpatient Hospital Stay: Payer: Medicare PPO

## 2022-06-27 ENCOUNTER — Encounter: Payer: Self-pay | Admitting: Hematology

## 2022-06-27 ENCOUNTER — Inpatient Hospital Stay: Payer: Medicare PPO | Attending: Hematology | Admitting: Hematology

## 2022-06-27 VITALS — BP 146/79 | HR 83 | Temp 97.7°F | Resp 18 | Ht 67.0 in | Wt 156.7 lb

## 2022-06-27 DIAGNOSIS — F17210 Nicotine dependence, cigarettes, uncomplicated: Secondary | ICD-10-CM | POA: Insufficient documentation

## 2022-06-27 DIAGNOSIS — M069 Rheumatoid arthritis, unspecified: Secondary | ICD-10-CM | POA: Diagnosis not present

## 2022-06-27 DIAGNOSIS — Z79899 Other long term (current) drug therapy: Secondary | ICD-10-CM | POA: Insufficient documentation

## 2022-06-27 DIAGNOSIS — D72820 Lymphocytosis (symptomatic): Secondary | ICD-10-CM | POA: Diagnosis present

## 2022-06-27 DIAGNOSIS — G610 Guillain-Barre syndrome: Secondary | ICD-10-CM | POA: Insufficient documentation

## 2022-06-27 LAB — CBC WITH DIFFERENTIAL (CANCER CENTER ONLY)
Abs Immature Granulocytes: 0.03 10*3/uL (ref 0.00–0.07)
Basophils Absolute: 0.1 10*3/uL (ref 0.0–0.1)
Basophils Relative: 1 %
Eosinophils Absolute: 0 10*3/uL (ref 0.0–0.5)
Eosinophils Relative: 0 %
HCT: 40.8 % (ref 36.0–46.0)
Hemoglobin: 14 g/dL (ref 12.0–15.0)
Immature Granulocytes: 0 %
Lymphocytes Relative: 27 %
Lymphs Abs: 2.7 10*3/uL (ref 0.7–4.0)
MCH: 30.8 pg (ref 26.0–34.0)
MCHC: 34.3 g/dL (ref 30.0–36.0)
MCV: 89.7 fL (ref 80.0–100.0)
Monocytes Absolute: 0.7 10*3/uL (ref 0.1–1.0)
Monocytes Relative: 7 %
Neutro Abs: 6.5 10*3/uL (ref 1.7–7.7)
Neutrophils Relative %: 65 %
Platelet Count: 194 10*3/uL (ref 150–400)
RBC: 4.55 MIL/uL (ref 3.87–5.11)
RDW: 14 % (ref 11.5–15.5)
WBC Count: 10 10*3/uL (ref 4.0–10.5)
nRBC: 0 % (ref 0.0–0.2)

## 2022-06-27 LAB — CMP (CANCER CENTER ONLY)
ALT: 13 U/L (ref 0–44)
AST: 20 U/L (ref 15–41)
Albumin: 4.4 g/dL (ref 3.5–5.0)
Alkaline Phosphatase: 72 U/L (ref 38–126)
Anion gap: 5 (ref 5–15)
BUN: 6 mg/dL — ABNORMAL LOW (ref 8–23)
CO2: 30 mmol/L (ref 22–32)
Calcium: 9.1 mg/dL (ref 8.9–10.3)
Chloride: 106 mmol/L (ref 98–111)
Creatinine: 0.72 mg/dL (ref 0.44–1.00)
GFR, Estimated: >60 mL/min (ref >60)
Glucose, Bld: 120 mg/dL — ABNORMAL HIGH (ref 70–99)
Potassium: 4 mmol/L (ref 3.5–5.1)
Sodium: 141 mmol/L (ref 135–145)
Total Bilirubin: 1 mg/dL (ref 0.3–1.2)
Total Protein: 6.6 g/dL (ref 6.5–8.1)

## 2022-06-27 LAB — LACTATE DEHYDROGENASE: LDH: 215 U/L — ABNORMAL HIGH (ref 98–192)

## 2022-06-27 LAB — HEPATITIS C ANTIBODY: HCV Ab: NONREACTIVE

## 2022-06-27 LAB — HIV ANTIBODY (ROUTINE TESTING W REFLEX): HIV Screen 4th Generation wRfx: NONREACTIVE

## 2022-06-27 NOTE — Progress Notes (Signed)
HEMATOLOGY/ONCOLOGY CONSULTATION NOTE  Date of Service: 06/27/2022  Patient Care Team: Roselee Nova, MD as PCP - General (Family Medicine) Warren Danes, PA-C as Physician Assistant (Dermatology)  CHIEF COMPLAINTS/PURPOSE OF CONSULTATION:  Lymphocytosis  HISTORY OF PRESENTING ILLNESS:   Leslie Brewer is a wonderful 61 y.o. female who has been referred to [place] by PA Hazel Sams for evaluation and management of Lymphocytosis.  She denies any fever, chills, shortness of breath, leg swelling, rashes, lumps, bumps, or lesions.  She denies any high risk sexual exposure. She had a negative PAP result for high risk HPV on 03/06/22.  She denies any Hx of blood transfusions.  She denies any family history of cancers, blood disorders, or autoimmune disease.  Patient has had rheumatoid arthritis x18 years and has been on Enbrel x5-6 years. She reports having a mild RA flare recently with ankle swelling after a trip to [place]. She has not been on steroids in many years. Overall, her RA has been very well controlled.  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 She was seen by a neurologist after her symptoms had resolved. She did not undergo any steroid or IVIG treatments at that time.  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 She states that her Graves went into remission without any intervention and she no longer is on any thyroid medications.  She takes Calcium 500mg  daily.  She smokes 1ppd to 1.5ppd. She has been smoking this amount for 40 years. Patient has tried nicotine lozenges without improvement. She denies any alcohol use apart from social use. She denies any illicit drug use.  She has a surgical history of bilateral THA and left TKA. She uses a cane for ambulation.   Patient reports recent issues with depression as her family lives in [place] and she has not been around family for ~30 years. She has not sought treatment for her  depression. 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 She is not married. Her smoking habit has worsened as she has had more difficulty with her depression.  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 She states that she hates her house and would like to move when things settle down. She does like her community outside of her neighborhood and tries to go out to eat and enjoy local attractions. She is originally from [place].  MEDICAL HISTORY:  Past Medical History:  Diagnosis Date   Guillain Barr syndrome ([place])    History of Graves' disease    Rheumatoid arthritis ([place])     SURGICAL HISTORY: Past Surgical History:  Procedure Laterality Date   APPENDECTOMY     REPLACEMENT TOTAL KNEE Left 2020   TOTAL HIP ARTHROPLASTY Right    TOTAL HIP ARTHROPLASTY Left    TUBAL LIGATION      SOCIAL HISTORY: Social History   Socioeconomic History   Marital status: Divorced    Spouse name: Not on file   Number of children: Not on file   Years of education: Not on file   Highest education level: Not on file  Occupational History   Not on file  Tobacco Use   Smoking status: Every Day    Packs/day: 1.00    Years: 30.00    Total pack years: 30.00    Types: Cigarettes    Passive exposure: Never   Smokeless tobacco: Never  Vaping Use   Vaping Use: Never used  Substance and Sexual Activity   Alcohol use: Yes    Comment: occ   Drug use: Not Currently   Sexual activity: Not on file  Other Topics Concern   Not on file  Social History Narrative   Not on file   Social Determinants of Health   Financial Resource Strain: Not on file  Food Insecurity: Not on file  Transportation Needs: Not on file  Physical Activity: Not on file  Stress: Not on file  Social Connections: Not on file  Intimate Partner Violence: Not on file    FAMILY HISTORY: Family History  Problem Relation Age of Onset   Cancer Mother     Healthy Daughter     ALLERGIES:  is allergic to latex.  MEDICATIONS:  Current Outpatient Medications  Medication Sig Dispense Refill   Acetaminophen (TYLENOL ARTHRITIS PAIN PO) as needed.     Ascorbic Acid (VITAMIN C) 500 MG CAPS See admin instructions.     cetirizine (ZYRTEC) 10 MG tablet Take 10 mg by mouth as needed for allergies.     Cholecalciferol (VITAMIN D) 50 MCG (2000 UT) tablet 1 tablet     etanercept (ENBREL SURECLICK) 50 MG/ML injection Inject 50 mg into the skin once a week. 12 mL 0   SENNA PLUS 8.6-50 MG tablet Take by mouth.     traMADol (ULTRAM) 50 MG tablet Take 50 mg by mouth every 6 (six) hours as needed.     triamcinolone ointment (KENALOG) 0.1 % Apply topically 2 (two) times daily as needed.     chlorhexidine (PERIDEX) 0.12 % solution 15 mLs 2 (two) times daily. (Patient not taking: Reported on 05/31/2022)     levocetirizine (XYZAL) 5 MG tablet Take 5 mg by mouth at bedtime as needed. (Patient not taking: Reported on 06/27/2022)     No current facility-administered medications for this visit.    REVIEW OF SYSTEMS:    10 Point review of Systems was done is negative except as noted above.  PHYSICAL EXAMINATION: ECOG PERFORMANCE STATUS: 1 - Symptomatic but completely ambulatory  . Vitals:   06/27/22 1047  BP: (!) 146/79  Pulse: 83  Resp: 18  Temp: 97.7 F (36.5 C)  SpO2: 100%   Filed Weights   06/27/22 1047  Weight: 156 lb 11.2 oz (71.1 kg)   .Body mass index is 24.54 kg/m.  GENERAL:alert, in no acute distress and comfortable SKIN: no acute rashes, no significant lesions EYES: conjunctiva are pink and non-injected, sclera anicteric OROPHARYNX: MMM, no exudates, no oropharyngeal erythema or ulceration NECK: supple, no JVD LYMPH:  no palpable lymphadenopathy in the cervical, axillary or inguinal regions LUNGS: clear to auscultation b/l with normal respiratory effort HEART: regular rate & rhythm ABDOMEN:  normoactive bowel sounds , non tender, not  distended. Extremity: no pedal edema PSYCH: alert & oriented x 3 with fluent speech NEURO: no focal motor/sensory deficits  LABORATORY DATA:  I have reviewed the data as listed     Latest Ref Rng & Units 05/31/2022    9:52 AM 03/13/2022    9:49 AM 10/12/2021   11:10 AM  CBC  WBC 3.8 - 10.8 Thousand/uL 10.1  12.1  10.6   Hemoglobin 11.7 - 15.5 g/dL 84.1  32.4  40.1   Hematocrit 35.0 - 45.0 % 46.5  46.6  45.1   Platelets 140 - 400 Thousand/uL 231  220  250    Component     Latest Ref Rng  07/13/2021 10/12/2021 03/13/2022  WBC     3.8 - 10.8 Thousand/uL 9.8  10.6  12.1 (H)   RBC     3.80 - 5.10 Million/uL 4.69  4.84  4.93   Hemoglobin     11.7 - 15.5 g/dL 27.7  82.4  23.5   HCT     35.0 - 45.0 % 43.8  45.1 (H)  46.6 (H)   MCV     80.0 - 100.0 fL 93.4  93.2  94.5   MCH     27.0 - 33.0 pg 31.1  31.0  31.2   MCHC     32.0 - 36.0 g/dL 36.1  44.3  15.4   RDW     11.0 - 15.0 % 12.9  12.5  12.5   Platelets     140 - 400 Thousand/uL 265  250  220   MPV     7.5 - 12.5 fL 10.8  10.9  11.0   NEUT#     1,500 - 7,800 cells/uL 4,714  5,819  6,873   Lymphocyte #     850 - 3,900 cells/uL 4,145 (H)  3,943 (H)  4,259 (H)   Absolute Monocytes     200 - 950 cells/uL 794  668  799   Eosinophils Absolute     15 - 500 cells/uL 69  95  121   Basophils Absolute     0 - 200 cells/uL 78  74  48   Neutrophils     % 48.1  54.9  56.8   Total Lymphocyte     % 42.3  37.2  35.2   Monocytes Relative     % 8.1  6.3  6.6   Eosinophil     % 0.7  0.9  1.0   Basophil     % 0.8  0.7  0.4    Component     Latest Ref Rng 05/31/2022  WBC     3.8 - 10.8 Thousand/uL 10.1   RBC     3.80 - 5.10 Million/uL 5.02   Hemoglobin     11.7 - 15.5 g/dL 00.8 (H)   HCT     67.6 - 45.0 % 46.5 (H)   MCV     80.0 - 100.0 fL 92.6   MCH     27.0 - 33.0 pg 31.3   MCHC     32.0 - 36.0 g/dL 19.5   RDW     09.3 - 26.7 % 13.0   Platelets     140 - 400 Thousand/uL 231   MPV     7.5 - 12.5 fL 10.4   NEUT#      1,500 - 7,800 cells/uL 4,616   Lymphocyte #     850 - 3,900 cells/uL 4,323 (H)   Absolute Monocytes     200 - 950 cells/uL 939   Eosinophils Absolute     15 - 500 cells/uL 152   Basophils Absolute     0 - 200 cells/uL 71   Neutrophils     % 45.7   Total Lymphocyte     % 42.8   Monocytes Relative     % 9.3   Eosinophil     % 1.5   Basophil     % 0.7         Latest Ref Rng & Units 05/31/2022    9:52 AM 03/13/2022    9:49 AM 10/12/2021   11:10 AM  CMP  Glucose 65 -  99 mg/dL 75  83  85   BUN 7 - 25 mg/dL 10  8  9    Creatinine 0.50 - 1.05 mg/dL  2.02  5.42   Sodium 135 - 146 mmol/L 143  141  141   Potassium 3.5 - 5.3 mmol/L 4.6  4.5  4.4   Chloride 98 - 110 mmol/L 106  106  106   CO2 20 - 32 mmol/L 30  26  31    Calcium 8.6 - 10.4 mg/dL 9.7  7.06  9.9   Total Protein 6.1 - 8.1 g/dL 7.3  7.1  6.7   Total Bilirubin 0.2 - 1.2 mg/dL 0.5  0.6  0.8   AST 10 - 35 U/L 21  19  19    ALT 6 - 29 U/L 15  10  13       RADIOGRAPHIC STUDIES: I have personally reviewed the radiological images as listed and agreed with the findings in the report. No results found.  ASSESSMENT & PLAN:   #1 Lymphocytosis -CBC historical results reviewed with patient today. -Platelets, WBC, and total WBC WNL. -Lymphs elevated to 4,323 on 05/31/22 compared to 4,259 on 03/13/22. -Discussed reactionary vs clonal processes. I explained that this is likely reactionary given her smoking Hx and RA. However, we also reviewed that Enbrel can be a cause for clonal processes and we will evaluate further to rule-out clonal etiology. -Will order CBC, CMP, Flow Cytometry, Hep C antibody, HIV antibody, and LDH labs.  #2 Tobacco Abuse -She is smoking 1-1.5ppd. -Strongly encouraged complete smoking cessation. -Advised her to try nicotine patches.  #3 Rheumatoid Arthritis -On Enbrel. -Symptoms well controlled. -Followed by Dr. .  #4 Bone Health -Scheduled bone density scan on 07/31/22.  FOLLOW  UP: Phone visit in 2 weeks to discuss labs  .The total time spent in the appointment was 60 minutes* .  All of the patient's questions were answered with apparent satisfaction. The patient knows to call the clinic with any problems, questions or concerns.   07/31/22 MD MS AAHIVMS [facility] Hematology/Oncology Physician [facility]  .*Total Encounter Time as defined by the Centers for Medicare and Medicaid Services includes, in addition to the face-to-face time of a patient visit (documented in the note above) non-face-to-face time: obtaining and reviewing outside history, ordering and reviewing medications, tests or procedures, care coordination (communications with other health care professionals or caregivers) and documentation in the medical record.   I, Wyvonnia Lora, am acting as scribe for Dr. [facility], MD  .I have reviewed the above documentation for accuracy and completeness, and I agree with the above. [facility] MD

## 2022-06-29 LAB — FLOW CYTOMETRY

## 2022-06-29 LAB — SURGICAL PATHOLOGY

## 2022-07-11 ENCOUNTER — Inpatient Hospital Stay (HOSPITAL_BASED_OUTPATIENT_CLINIC_OR_DEPARTMENT_OTHER): Payer: Medicare PPO | Admitting: Hematology

## 2022-07-11 DIAGNOSIS — D72820 Lymphocytosis (symptomatic): Secondary | ICD-10-CM | POA: Diagnosis not present

## 2022-07-11 NOTE — Progress Notes (Signed)
HEMATOLOGY/ONCOLOGY CLINIC NOTE  Date of Service: 07/11/22   Patient Care Team: Ellyn Hack, MD as PCP - General (Family Medicine) Glyn Ade, PA-C as Physician Assistant (Dermatology)  CHIEF COMPLAINTS/PURPOSE OF CONSULTATION:  Lymphocytosis  HISTORY OF PRESENTING ILLNESS:   Leslie Brewer is a wonderful 61 y.o. female who has been referred to [place] by PA Sherron Ales for evaluation and management of Lymphocytosis.  She denies any fever, chills, shortness of breath, leg swelling, rashes, lumps, bumps, or lesions.  She denies any high risk sexual exposure. She had a negative PAP result for high risk HPV on 03/06/22.  She denies any Hx of blood transfusions.  She denies any family history of cancers, blood disorders, or autoimmune disease.  Patient has had rheumatoid arthritis x18 years and has been on Enbrel x5-6 years. She reports having a mild RA flare recently with ankle swelling after a trip to [place]. She has not been on steroids in many years. Overall, her RA has been very well controlled.  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 She was seen by a neurologist after her symptoms had resolved. She did not undergo any steroid or IVIG treatments at that time.  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 She states that her Graves went into remission without any intervention and she no longer is on any thyroid medications.  She takes Calcium 500mg  daily.  She smokes 1ppd to 1.5ppd. She has been smoking this amount for 40 years. Patient has tried nicotine lozenges without improvement. She denies any alcohol use apart from social use. She denies any illicit drug use.  She has a surgical history of bilateral THA and left TKA. She uses a cane for ambulation.   Patient reports recent issues with depression as her family lives in [place] and she has not been around family for ~30 years. She has not sought treatment for her depression. 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 She is not married. Her smoking habit has worsened as she has had more difficulty with her depression.  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 She states that she hates her house and would like to move when things settle down. She does like her community outside of her neighborhood and tries to go out to eat and enjoy local attractions. She is originally from [place].  Interval History:   Leslie Brewer is a wonderful 61 y.o. female who is here for continued evaluation and management of Lymphocytosis.  .I connected with [name] on 07/11/2022 at  9:00 AM EDT by telephone visit and verified that I am speaking with the correct person using two identifiers.   She was last seen by me on 06/27/2022 and was doing well overall.   She reports that she is doing well today with no new symptoms.  All her labs were discussed in details.   MEDICAL HISTORY:  Past Medical History:  Diagnosis Date   Guillain Barr syndrome [facility])    History of Graves' disease    Rheumatoid arthritis ([facility])     SURGICAL HISTORY: Past Surgical History:  Procedure Laterality Date   APPENDECTOMY     REPLACEMENT TOTAL KNEE Left 2020   TOTAL HIP ARTHROPLASTY Right    TOTAL HIP ARTHROPLASTY Left    TUBAL LIGATION      SOCIAL HISTORY: Social  History   Socioeconomic History   Marital status: Divorced    Spouse name: Not on file   Number of children: Not on file   Years of education: Not on file   Highest education level: Not on file  Occupational History   Not on file  Tobacco Use   Smoking status: Every Day    Packs/day: 1.00    Years: 30.00    Total pack years: 30.00    Types: Cigarettes    Passive exposure: Never   Smokeless tobacco: Never  Vaping Use   Vaping Use: Never used  Substance and Sexual Activity   Alcohol use: Yes    Comment: occ   Drug use: Not Currently    Sexual activity: Not on file  Other Topics Concern   Not on file  Social History Narrative   Not on file   Social Determinants of Health   Financial Resource Strain: Not on file  Food Insecurity: Not on file  Transportation Needs: Not on file  Physical Activity: Not on file  Stress: Not on file  Social Connections: Not on file  Intimate Partner Violence: Not on file    FAMILY HISTORY: Family History  Problem Relation Age of Onset   Cancer Mother    Healthy Daughter     ALLERGIES:  is allergic to latex.  MEDICATIONS:  Current Outpatient Medications  Medication Sig Dispense Refill   Acetaminophen (TYLENOL ARTHRITIS PAIN PO) as needed.     Ascorbic Acid (VITAMIN C) 500 MG CAPS See admin instructions.     cetirizine (ZYRTEC) 10 MG tablet Take 10 mg by mouth as needed for allergies.     chlorhexidine (PERIDEX) 0.12 % solution 15 mLs 2 (two) times daily. (Patient not taking: Reported on 05/31/2022)     Cholecalciferol (VITAMIN D) 50 MCG (2000 UT) tablet 1 tablet     etanercept (ENBREL SURECLICK) 50 MG/ML injection Inject 50 mg into the skin once a week. 12 mL 0   levocetirizine (XYZAL) 5 MG tablet Take 5 mg by mouth at bedtime as needed. (Patient not taking: Reported on 06/27/2022)     SENNA PLUS 8.6-50 MG tablet Take by mouth.     traMADol (ULTRAM) 50 MG tablet Take 50 mg by mouth every 6 (six) hours as needed.     triamcinolone ointment (KENALOG) 0.1 % Apply topically 2 (two) times daily as needed.     No current facility-administered medications for this visit.    REVIEW OF SYSTEMS:    10 Point review of Systems was done is negative except as noted above.  PHYSICAL EXAMINATION: Telemedicine visit  LABORATORY DATA:   .    Latest Ref Rng & Units 06/27/2022   11:44 AM 05/31/2022    9:52 AM 03/13/2022    9:49 AM  CBC  WBC 4.0 - 10.5 K/uL 10.0  10.1  12.1   Hemoglobin 12.0 - 15.0 g/dL 25.9  56.3  87.5   Hematocrit 36.0 - 46.0 % 40.8  46.5  46.6   Platelets 150 - 400  K/uL 194  231  220    .CBC    Component Value Date/Time   WBC 10.0 06/27/2022 1144   WBC 10.1 05/31/2022 0952   RBC 4.55 06/27/2022 1144   HGB 14.0 06/27/2022 1144   HCT 40.8 06/27/2022 1144   PLT 194 06/27/2022 1144   MCV 89.7 06/27/2022 1144   MCH 30.8 06/27/2022 1144   MCHC 34.3 06/27/2022 1144   RDW 14.0 06/27/2022 1144  LYMPHSABS 2.7 06/27/2022 1144   MONOABS 0.7 06/27/2022 1144   EOSABS 0.0 06/27/2022 1144   BASOSABS 0.1 06/27/2022 1144     Component     Latest Ref Rng 07/13/2021 10/12/2021 03/13/2022  WBC     3.8 - 10.8 Thousand/uL 9.8  10.6  12.1 (H)   RBC     3.80 - 5.10 Million/uL 4.69  4.84  4.93   Hemoglobin     11.7 - 15.5 g/dL 64.4  03.4  74.2   HCT     35.0 - 45.0 % 43.8  45.1 (H)  46.6 (H)   MCV     80.0 - 100.0 fL 93.4  93.2  94.5   MCH     27.0 - 33.0 pg 31.1  31.0  31.2   MCHC     32.0 - 36.0 g/dL 59.5  63.8  75.6   RDW     11.0 - 15.0 % 12.9  12.5  12.5   Platelets     140 - 400 Thousand/uL 265  250  220   MPV     7.5 - 12.5 fL 10.8  10.9  11.0   NEUT#     1,500 - 7,800 cells/uL 4,714  5,819  6,873   Lymphocyte #     850 - 3,900 cells/uL 4,145 (H)  3,943 (H)  4,259 (H)   Absolute Monocytes     200 - 950 cells/uL 794  668  799   Eosinophils Absolute     15 - 500 cells/uL 69  95  121   Basophils Absolute     0 - 200 cells/uL 78  74  48   Neutrophils     % 48.1  54.9  56.8   Total Lymphocyte     % 42.3  37.2  35.2   Monocytes Relative     % 8.1  6.3  6.6   Eosinophil     % 0.7  0.9  1.0   Basophil     % 0.8  0.7  0.4    Component     Latest Ref Rng 05/31/2022  WBC     3.8 - 10.8 Thousand/uL 10.1   RBC     3.80 - 5.10 Million/uL 5.02   Hemoglobin     11.7 - 15.5 g/dL 43.3 (H)   HCT     29.5 - 45.0 % 46.5 (H)   MCV     80.0 - 100.0 fL 92.6   MCH     27.0 - 33.0 pg 31.3   MCHC     32.0 - 36.0 g/dL 18.8   RDW     41.6 - 60.6 % 13.0   Platelets     140 - 400 Thousand/uL 231   MPV     7.5 - 12.5 fL 10.4   NEUT#     1,500  - 7,800 cells/uL 4,616   Lymphocyte #     850 - 3,900 cells/uL 4,323 (H)   Absolute Monocytes     200 - 950 cells/uL 939   Eosinophils Absolute     15 - 500 cells/uL 152   Basophils Absolute     0 - 200 cells/uL 71   Neutrophils     % 45.7   Total Lymphocyte     % 42.8   Monocytes Relative     % 9.3   Eosinophil     % 1.5   Basophil     % 0.7    .  Latest Ref Rng & Units 06/27/2022   11:44 AM 05/31/2022    9:52 AM 03/13/2022    9:49 AM  CBC  WBC 4.0 - 10.5 K/uL 10.0  10.1  12.1   Hemoglobin 12.0 - 15.0 g/dL 14.0  15.7  15.4   Hematocrit 36.0 - 46.0 % 40.8  46.5  46.6   Platelets 150 - 400 K/uL 194  231  220    .    Latest Ref Rng & Units 06/27/2022   11:44 AM 05/31/2022    9:52 AM 03/13/2022    9:49 AM  CMP  Glucose 70 - 99 mg/dL 120  75  83   BUN 8 - 23 mg/dL 6  10  8    Creatinine 0.44 - 1.00 mg/dL 0.72  0.80  0.73   Sodium 135 - 145 mmol/L 141  143  141   Potassium 3.5 - 5.1 mmol/L 4.0  4.6  4.5   Chloride 98 - 111 mmol/L 106  106  106   CO2 22 - 32 mmol/L 30  30  26    Calcium 8.9 - 10.3 mg/dL 9.1  9.7  10.0   Total Protein 6.5 - 8.1 g/dL 6.6  7.3  7.1   Total Bilirubin 0.3 - 1.2 mg/dL 1.0  0.5  0.6   Alkaline Phos 38 - 126 U/L 72     AST 15 - 41 U/L 20  21  19    ALT 0 - 44 U/L 13  15  10     06/27/2022: DIAGNOSIS:   -No monoclonal B-cell population or significant T-cell abnormalities  identified.   GATING AND PHENOTYPIC ANALYSIS:   Gated population: Flow cytometric immunophenotyping is performed using  antibodies to the antigens listed in the table below. Electronic gates  are placed around a cell cluster displaying light scatter properties  corresponding to: lymphocytes    RADIOGRAPHIC STUDIES: I have personally reviewed the radiological images as listed and agreed with the findings in the report. No results found.  ASSESSMENT & PLAN:   #1 Lymphocytosis -CBC historical results reviewed with patient today. -Platelets, WBC, and total WBC  WNL. -Lymphs elevated to 4,323 on 05/31/22 compared to 4,259 on 03/13/22. -Discussed reactionary vs clonal processes. I explained that this is likely reactionary given her smoking Hx and RA. However, we also reviewed that Enbrel can be a cause for clonal processes and we will evaluate further to rule-out clonal etiology. -Will order CBC, CMP, Flow Cytometry, Hep C antibody, HIV antibody, and LDH labs.  #2 Tobacco Abuse -She is smoking 1-1.5ppd. -Strongly encouraged complete smoking cessation. -Advised her to try nicotine patches.  #3 Rheumatoid Arthritis -On Enbrel. -Symptoms well controlled. -Followed by Dr. Bo Merino.  #4 Bone Health -Scheduled bone density scan on 07/31/22.  Plan: -Recent labs on 06/27/2022 were discussed with the patient. Lab results showed WBC of 10.0 K, Hemoglobin of 14.0 K, and Platelets of 194. No lymphocytosis noted ALC 2.7k -flow cytometry did not show any concerns for a clonal lymphoproliferative disorder. -Hepatatis-C and HIV testing were negative. -we discussed lymphocytosis that has now resolved was likely reactive from smoking or RA FOLLOW UP: RTC with Dr Irene Limbo as needed  The total time spent in the appointment was 20 minutes* .  All of the patient's questions were answered with apparent satisfaction. The patient knows to call the clinic with any problems, questions or concerns.  I,Param Shah,acting as a Education administrator for Sullivan Lone, MD.,have documented all relevant documentation on the behalf of Sullivan Lone,  MD,as directed by  Wyvonnia Lora, MD while in the presence of Wyvonnia Lora, MD.   Wyvonnia Lora MD MS AAHIVMS [facility] Hematology/Oncology Physician [facility]  .*Total Encounter Time as defined by the Centers for Medicare and Medicaid Services includes, in addition to the face-to-face time of a patient visit (documented in the note above) non-face-to-face time: obtaining and reviewing outside history, ordering and reviewing medications,  tests or procedures, care coordination (communications with other health care professionals or caregivers) and documentation in the medical record.

## 2022-07-31 ENCOUNTER — Ambulatory Visit
Admission: RE | Admit: 2022-07-31 | Discharge: 2022-07-31 | Disposition: A | Discharge status: Home / Self Care | Payer: Medicare PPO | Source: Ambulatory Visit | Attending: Registered Nurse | Admitting: Registered Nurse

## 2022-07-31 ENCOUNTER — Other Ambulatory Visit: Payer: Medicare PPO

## 2022-07-31 DIAGNOSIS — E2839 Other primary ovarian failure: Secondary | ICD-10-CM

## 2022-08-20 NOTE — Progress Notes (Signed)
Office Visit Note  Patient: Leslie Brewer             Date of Birth: 1961/09/14           MRN: [id number]             PCP: Ellyn Hack, MD Referring: Ellyn Hack, MD Visit Date: 08/31/2022 Occupation: @GUAROCC @  Subjective:  Pain in multiple joints   History of Present Illness: Leslie Brewer is a 61 y.o. female with history of seronegative rheumatoid arthritis and osteoarthritis.  She is currently on Enbrel 50 mg sq injections once weekly.  She continues to tolerate Enbrel without any side effects or injection site reactions.  She has not missed any doses of Enbrel recently.  Patient presents today with increased pain in the right wrist and right hand.  At times the pain is a 7 out of 10.  She has not noticed any joint swelling.  She is also been having some increased discomfort in her right shoulder especially at night when lying on her right side.  She has chronic pain in both knee joints especially the left knee replacement.  She is using a cane to assist with ambulation.  She denies any new medical conditions. She denies any recent or recurrent infections. She is planning on getting the COVID-19 booster.   Activities of Daily Living:  Patient reports morning stiffness for 1 hour.   Patient Reports nocturnal pain.  Difficulty dressing/grooming: Reports Difficulty climbing stairs: Reports Difficulty getting out of chair: Reports Difficulty using hands for taps, buttons, cutlery, and/or writing: Reports  Review of Systems  Constitutional:  Negative for fatigue.  HENT:  Negative for mouth sores and mouth dryness.   Eyes:  Positive for dryness.  Respiratory:  Positive for shortness of breath.   Cardiovascular:  Negative for chest pain and palpitations.  Gastrointestinal:  Positive for constipation. Negative for blood in stool and diarrhea.  Endocrine: Negative for increased urination.  Genitourinary:  Negative for involuntary urination.  Musculoskeletal:  Positive  for joint pain, joint pain, joint swelling and morning stiffness. Negative for gait problem, myalgias, muscle weakness, muscle tenderness and myalgias.  Skin:  Negative for color change, rash, hair loss and sensitivity to sunlight.  Allergic/Immunologic: Negative for susceptible to infections.  Neurological:  Positive for dizziness and headaches.  Hematological:  Negative for swollen glands.  Psychiatric/Behavioral:  Positive for depressed mood and sleep disturbance. The patient is not nervous/anxious.     PMFS History:  Patient Active Problem List   Diagnosis Date Noted   Rheumatoid arthritis involving multiple sites with positive rheumatoid factor (HCC) 02/23/2021   H/O bilateral hip replacements 02/23/2021   History of total left knee replacement 02/23/2021    Past Medical History:  Diagnosis Date   Guillain Barr syndrome (HCC)    History of Graves' disease    Rheumatoid arthritis (HCC)     Family History  Problem Relation Age of Onset   Cancer Mother    Healthy Daughter    Past Surgical History:  Procedure Laterality Date   APPENDECTOMY     REPLACEMENT TOTAL KNEE Left 2020   TOTAL HIP ARTHROPLASTY Right    TOTAL HIP ARTHROPLASTY Left    TUBAL LIGATION     Social History   Social History Narrative   Not on file   Immunization History  Administered Date(s) Administered   Moderna Sars-Covid-2 Vaccination 01/01/2020, 01/29/2020, 08/29/2020, 06/13/2021     Objective: Vital Signs: BP 130/81 (  BP Location: Left Arm, Patient Position: Sitting, Cuff Size: Normal)   Pulse 84   Ht 5\' 7"  (1.702 m)   Wt 154 lb (69.9 kg)   BMI 24.12 kg/m    Physical Exam Vitals and nursing note reviewed.  Constitutional:      Appearance: She is well-developed.  HENT:     Head: Normocephalic and atraumatic.  Eyes:     Conjunctiva/sclera: Conjunctivae normal.  Cardiovascular:     Rate and Rhythm: Normal rate and regular rhythm.     Heart sounds: Normal heart sounds.  Pulmonary:      Effort: Pulmonary effort is normal.     Breath sounds: Normal breath sounds.  Abdominal:     General: Bowel sounds are normal.     Palpations: Abdomen is soft.  Musculoskeletal:     Cervical back: Normal range of motion.  Skin:    General: Skin is warm and dry.     Capillary Refill: Capillary refill takes less than 2 seconds.  Neurological:     Mental Status: She is alert and oriented to person, place, and time.  Psychiatric:        Behavior: Behavior normal.      Musculoskeletal Exam: C-spine has good range of motion.  Shoulder joints have good range of motion with some discomfort in the right shoulder.  Elbow joints, wrist joints, MCPs, PIPs, DIPs have good range of motion with no synovitis.  Tenderness over the second through fifth PIP joints and all DIP joints of the right hand.  No wrist tenderness or synovitis over MCP joints.  Complete fist formation noted bilaterally.  Hip replacements have slightly limited range of motion but no groin pain currently.  Left knee replacement has limited extension.  Right knee joint has good range of motion with some discomfort.  Ankle joints have good range of motion with no tenderness or synovitis.  CDAI Exam: CDAI Score: 5  Patient Global: 7 mm; Provider Global: 3 mm Swollen: 0 ; Tender: 8  Joint Exam 08/31/2022      Right  Left  PIP 2   Tender     PIP 3   Tender     PIP 4   Tender     PIP 5   Tender     DIP 2   Tender     DIP 3   Tender     DIP 4   Tender     DIP 5   Tender        Investigation: No additional findings.  Imaging: No results found.  Recent Labs: Lab Results  Component Value Date   WBC 10.0 06/27/2022   HGB 14.0 06/27/2022   PLT 194 06/27/2022   NA 141 06/27/2022   K 4.0 06/27/2022   CL 106 06/27/2022   CO2 30 06/27/2022   GLUCOSE 120 (H) 06/27/2022   BUN 6 (L) 06/27/2022   CREATININE 0.72 06/27/2022   BILITOT 1.0 06/27/2022   ALKPHOS 72 06/27/2022   AST 20 06/27/2022   ALT 13 06/27/2022   PROT 6.6  06/27/2022   ALBUMIN 4.4 06/27/2022   CALCIUM 9.1 06/27/2022   GFRAA 101 02/23/2021   QFTBGOLDPLUS NEGATIVE 03/13/2022    Speciality Comments: dxd in [place] 17 years ago.  Treated with prednisone many years, Enbrel for 6 years off and on  Procedures:  No procedures performed Allergies: Latex     Assessment / Plan:     Visit Diagnoses: Rheumatoid arthritis of multiple sites with negative  rheumatoid factor (HCC) - RF negative, anti-CCP negative, positive ANA. Dxd in [place].  Treated with prednisone for many years and then Enbrel off and on for the last 6 years: Patient presents today with increased pain in her right hand and right wrist joint.  On examination she has tenderness over the second through fifth PIP joints and all DIP joints of the right hand but no inflammation was noted.  She had no tenderness or synovitis over the MCP joints or her right wrist.  Her discomfort seems to be due to underlying osteoarthritis.  Offered to schedule ultrasound of both hands to assess for synovitis but she declined at this time.  She was in agreement that her symptoms seem consistent with osteoarthritis.  She has not noticed any joint swelling.  Treatment options for osteoarthritis were discussed today in detail.  The patient was encouraged to start using her arthritis compression glove and performing hand exercises daily.  She was given a handout of hand exercises to perform.  Discussed the importance of joint protection and muscle strengthening.  If her symptoms progress or worsen she will notify us and we can place a referral for hand therapy in the future.  I discussed the list of natural anti-inflammatories today in detail-tumeric, tart cherry, ginger, and omega 3.  All questions were addressed.  She plans on adding on the natural anti-inflammatories to her current treatment regimen.  For management of rheumatoid arthritis she will remain on Enbrel 50 mg subcutaneous injections once weekly.  She was advised to  notify us if she develops signs or symptoms of a flare.  She will follow-up in the office in 2 months or sooner if needed.  High risk medication use - Enbrel 50 mg sq injections once weekly.  Tolerating Enbrel without any injection site reactions or side effects.  No missed doses. CBC and CMP updated on 06/27/22. Her next lab work will be due in January and every 3 months. Standing orders for CBC and CMP were placed today.  TB gold negative on 03/13/22.   She has not had any recent or recurrent infections.  Discussed the importance of holding enbrel if she develops signs or symptoms of an infection and to resume once the infection has completely cleared.  - Plan: CBC with Differential/Platelet, COMPLETE METABOLIC PANEL WITH GFR  Elevated CK - CK was 141 on 04/13/2021.  No muscle weakness.  Primary osteoarthritis of both hands: Different treatment options for management of osteoarthritis of both hands was discussed today in detail.  Discussed the importance of joint protection and muscle strengthening.  She was given a handout of hand exercises to perform.  I offered referral to hand therapy but she would like to hold off at this time and see if she has improvement with home exercises.  I also discussed the list of natural anti-inflammatories which she plans on taking.  She was advised to notify us if her symptoms persist or worsen or if she develops increased joint swelling at which time we can schedule an ultrasound of both hands to assess for synovitis.  H/O bilateral hip replacements: Doing well.  Slightly limited range of motion but no groin pain.  Primary osteoarthritis of right knee: Chronic pain. She experiences intermittent nocturnal pain.   History of total left knee replacement: Chronic pain. Using a cane to assist with ambulation.   Primary osteoarthritis of both feet: She is not experiencing any increased discomfort in her feet at this time.  She has good range  of motion of both ankle  joints with no tenderness or synovitis.  Other medical conditions are listed as follows:   Graves' ophthalmopathy  History of COPD  H/O Guillain-Barre syndrome  Smoker  Orders: Orders Placed This Encounter  Procedures   CBC with Differential/Platelet   COMPLETE METABOLIC PANEL WITH GFR   No orders of the defined types were placed in this encounter.     Follow-Up Instructions: Return in about 2 months (around 11/01/2022) for Rheumatoid arthritis, Osteoarthritis.   Gearldine Bienenstock, PA-C  Note - This record has been created using Dragon software.  Chart creation errors have been sought, but may not always  have been located. Such creation errors do not reflect on  the standard of medical care.

## 2022-08-23 ENCOUNTER — Other Ambulatory Visit: Payer: Self-pay | Admitting: *Deleted

## 2022-08-23 MED ORDER — ENBREL SURECLICK 50 MG/ML ~~LOC~~ SOAJ: 50.0000 mg | SUBCUTANEOUS | 0 refills | Status: DC

## 2022-08-23 NOTE — Telephone Encounter (Signed)
Patient called the office requesting a refill on Enbrel.  Next Visit: 08/31/2022  Last Visit: 05/31/2022  Last Fill: 05/31/2022  DX: Rheumatoid arthritis of multiple sites with negative rheumatoid factor   Current Dose per office note 05/31/2022: Enbrel 50 mg sq injections once weekly   Labs: 06/27/2022 Glucose 120, BUN 6  TB Gold: 03/13/2022 Neg    Okay to refill Enbrel?

## 2022-08-31 ENCOUNTER — Ambulatory Visit: Payer: Medicare PPO | Attending: Physician Assistant | Admitting: Physician Assistant

## 2022-08-31 ENCOUNTER — Encounter: Payer: Self-pay | Admitting: Physician Assistant

## 2022-08-31 VITALS — BP 130/81 | HR 84 | Ht 67.0 in | Wt 154.0 lb

## 2022-08-31 DIAGNOSIS — Z96652 Presence of left artificial knee joint: Secondary | ICD-10-CM

## 2022-08-31 DIAGNOSIS — E0500 Thyrotoxicosis with diffuse goiter without thyrotoxic crisis or storm: Secondary | ICD-10-CM

## 2022-08-31 DIAGNOSIS — M0609 Rheumatoid arthritis without rheumatoid factor, multiple sites: Secondary | ICD-10-CM | POA: Diagnosis not present

## 2022-08-31 DIAGNOSIS — M19041 Primary osteoarthritis, right hand: Secondary | ICD-10-CM

## 2022-08-31 DIAGNOSIS — M19042 Primary osteoarthritis, left hand: Secondary | ICD-10-CM

## 2022-08-31 DIAGNOSIS — H05839 Thyroid orbitopathy, unspecified orbit: Secondary | ICD-10-CM

## 2022-08-31 DIAGNOSIS — R748 Abnormal levels of other serum enzymes: Secondary | ICD-10-CM

## 2022-08-31 DIAGNOSIS — Z96643 Presence of artificial hip joint, bilateral: Secondary | ICD-10-CM

## 2022-08-31 DIAGNOSIS — M19071 Primary osteoarthritis, right ankle and foot: Secondary | ICD-10-CM

## 2022-08-31 DIAGNOSIS — Z8709 Personal history of other diseases of the respiratory system: Secondary | ICD-10-CM

## 2022-08-31 DIAGNOSIS — M19072 Primary osteoarthritis, left ankle and foot: Secondary | ICD-10-CM

## 2022-08-31 DIAGNOSIS — Z8669 Personal history of other diseases of the nervous system and sense organs: Secondary | ICD-10-CM

## 2022-08-31 DIAGNOSIS — Z79899 Other long term (current) drug therapy: Secondary | ICD-10-CM

## 2022-08-31 DIAGNOSIS — M1711 Unilateral primary osteoarthritis, right knee: Secondary | ICD-10-CM

## 2022-08-31 DIAGNOSIS — F17200 Nicotine dependence, unspecified, uncomplicated: Secondary | ICD-10-CM

## 2022-08-31 NOTE — Patient Instructions (Addendum)
Standing Labs We placed an order today for your standing lab work.   Please have your standing labs drawn in January and every 3 months   Please have your labs drawn 2 weeks prior to your appointment so that the provider can discuss your lab results at your appointment.  Please note that you may see your imaging and lab results in MyChart before we have reviewed them. We will contact you once all results are reviewed. Please allow our office up to 72 hours to thoroughly review all of the results before contacting the office for clarification of your results.  Lab hours are:   Monday through Thursday from 8:00 am -12:30 pm and 1:00 pm-5:00 pm and Friday from 8:00 am-12:00 pm.  Please be advised, all patients with office appointments requiring lab work will take precedent over walk-in lab work.   Labs are drawn by [facility]. Please bring your co-pay at the time of your lab draw.  You may receive a bill from [facility] for your lab work.  Please note if you are on Hydroxychloroquine and and an order has been placed for a Hydroxychloroquine level, you will need to have it drawn 4 hours or more after your last dose.  If you wish to have your labs drawn at another location, please call the office 24 hours in advance so we can fax the orders.  The office is located at [address], [address], [place], [place] [address] No appointment is necessary.    If you have any questions regarding directions or hours of operation,  please call [phone].   As a reminder, please drink plenty of water prior to coming for your lab work. Thanks!  If you have signs or symptoms of an infection or start antibiotics: First, call your PCP for workup of your infection. Hold your medication through the infection, until you complete your antibiotics, and until symptoms resolve if you take the following: Injectable medication (Actemra, Benlysta, Cimzia, Cosentyx, Enbrel, Humira, Kevzara, Orencia, Remicade, Simponi,  Stelara, Taltz, Tremfya) Methotrexate Leflunomide (Arava) Mycophenolate (Cellcept) Harriette Ohara, Olumiant, or Rinvoq  Vaccines You are taking a medication(s) that can suppress your immune system.  The following immunizations are recommended: Flu annually Covid-19  Td/Tdap (tetanus, diphtheria, pertussis) every 10 years Pneumonia (Prevnar 15 then Pneumovax 23 at least 1 year apart.  Alternatively, can take Prevnar 20 without needing additional dose) Shingrix: 2 doses from 4 weeks to 6 months apart  Please check with your PCP to make sure you are up to date.   Hand Exercises Hand exercises can be helpful for almost anyone. These exercises can strengthen the hands, improve flexibility and movement, and increase blood flow to the hands. These results can make work and daily tasks easier. Hand exercises can be especially helpful for people who have joint pain from arthritis or have nerve damage from overuse (carpal tunnel syndrome). These exercises can also help people who have injured a hand. Exercises Most of these hand exercises are gentle stretching and motion exercises. It is usually safe to do them often throughout the day. Warming up your hands before exercise may help to reduce stiffness. You can do this with gentle massage or by placing your hands in warm water for 10-15 minutes. It is normal to feel some stretching, pulling, tightness, or mild discomfort as you begin new exercises. This will gradually improve. Stop an exercise right away if you feel sudden, severe pain or your pain gets worse. Ask your health care provider which exercises  are best for you. Knuckle bend or "claw" fist  Stand or sit with your arm, hand, and all five fingers pointed straight up. Make sure to keep your wrist straight during the exercise. Gently bend your fingers down toward your palm until the tips of your fingers are touching the top of your palm. Keep your big knuckle straight and just bend the small  knuckles in your fingers. Hold this position for __________ seconds. Straighten (extend) your fingers back to the starting position. Repeat this exercise 5-10 times with each hand. Full finger fist  Stand or sit with your arm, hand, and all five fingers pointed straight up. Make sure to keep your wrist straight during the exercise. Gently bend your fingers into your palm until the tips of your fingers are touching the middle of your palm. Hold this position for __________ seconds. Extend your fingers back to the starting position, stretching every joint fully. Repeat this exercise 5-10 times with each hand. Straight fist Stand or sit with your arm, hand, and all five fingers pointed straight up. Make sure to keep your wrist straight during the exercise. Gently bend your fingers at the big knuckle, where your fingers meet your hand, and the middle knuckle. Keep the knuckle at the tips of your fingers straight and try to touch the bottom of your palm. Hold this position for __________ seconds. Extend your fingers back to the starting position, stretching every joint fully. Repeat this exercise 5-10 times with each hand. Tabletop  Stand or sit with your arm, hand, and all five fingers pointed straight up. Make sure to keep your wrist straight during the exercise. Gently bend your fingers at the big knuckle, where your fingers meet your hand, as far down as you can while keeping the small knuckles in your fingers straight. Think of forming a tabletop with your fingers. Hold this position for __________ seconds. Extend your fingers back to the starting position, stretching every joint fully. Repeat this exercise 5-10 times with each hand. Finger spread  Place your hand flat on a table with your palm facing down. Make sure your wrist stays straight as you do this exercise. Spread your fingers and thumb apart from each other as far as you can until you feel a gentle stretch. Hold this position  for __________ seconds. Bring your fingers and thumb tight together again. Hold this position for __________ seconds. Repeat this exercise 5-10 times with each hand. Making circles  Stand or sit with your arm, hand, and all five fingers pointed straight up. Make sure to keep your wrist straight during the exercise. Make a circle by touching the tip of your thumb to the tip of your index finger. Hold for __________ seconds. Then open your hand wide. Repeat this motion with your thumb and each finger on your hand. Repeat this exercise 5-10 times with each hand. Thumb motion  Sit with your forearm resting on a table and your wrist straight. Your thumb should be facing up toward the ceiling. Keep your fingers relaxed as you move your thumb. Lift your thumb up as high as you can toward the ceiling. Hold for __________ seconds. Bend your thumb across your palm as far as you can, reaching the tip of your thumb for the small finger (pinkie) side of your palm. Hold for __________ seconds. Repeat this exercise 5-10 times with each hand. Grip strengthening  Hold a stress ball or other soft ball in the middle of your hand. Slowly increase the  pressure, squeezing the ball as much as you can without causing pain. Think of bringing the tips of your fingers into the middle of your palm. All of your finger joints should bend when doing this exercise. Hold your squeeze for __________ seconds, then relax. Repeat this exercise 5-10 times with each hand. Contact a health care provider if: Your hand pain or discomfort gets much worse when you do an exercise. Your hand pain or discomfort does not improve within 2 hours after you exercise. If you have any of these problems, stop doing these exercises right away. Do not do them again unless your health care provider says that you can. Get help right away if: You develop sudden, severe hand pain or swelling. If this happens, stop doing these exercises right away.  Do not do them again unless your health care provider says that you can. This information is not intended to replace advice given to you by your health care provider. Make sure you discuss any questions you have with your health care provider. Document Revised: 12/29/2020 Document Reviewed: 12/29/2020 Elsevier Patient Education  [place].

## 2022-09-05 ENCOUNTER — Telehealth: Payer: Self-pay | Admitting: Pharmacist

## 2022-09-05 NOTE — Telephone Encounter (Signed)
Submitted a Prior Authorization RENEWAL request to [facility] for ENBREL via CoverMyMeds. Will update once we receive a response.  Key: Earmon Phoenix, PharmD, MPH, BCPS, CPP Clinical Pharmacist (Rheumatology and Pulmonology)

## 2022-09-07 NOTE — Telephone Encounter (Signed)
Received a fax regarding Prior Authorization from [facility] for ENBREL. Authorization has been DENIED because there is a concern that may prevent your drug from being filled, or your reimbursement from being paid. The Medicare rule in the Prescription Drug Manual (Chapter 14, Appendix E) says drugs covered under a Pharmaceutical Assistance Program (PAP) cannot be covered under a Part D plan. [employer] follows Medicare rules.  Note: the PA is approved for/through 09/24/23 by Oscar La, PharmD, MPH, BCPS, CPP Clinical Pharmacist (Rheumatology and Pulmonology)

## 2022-09-24 HISTORY — PX: CATARACT EXTRACTION: SUR2

## 2022-10-19 NOTE — Progress Notes (Deleted)
Office Visit Note  Patient: Leslie Brewer             Date of Birth: 06/06/1961           MRN: [id number]             PCP: Roselee Nova, MD Referring: Roselee Nova, MD Visit Date: 11/02/2022 Occupation: @GUAROCC @  Subjective:    History of Present Illness: Leslie Brewer is a 62 y.o. female with history of seronegative rheumatoid arthritis and osteoarthritis.  Patient remains on Enbrel 50 mg sq injections once weekly.   CBC and CMP updated on 06/27/22.  Orders for CBC and CMP were released today. TB gold negative on 03/13/22.  Discussed the importance of holding Enbrel if she develops signs or symptoms of an infection and to resume once the infection has completely cleared.  Activities of Daily Living:  Patient reports morning stiffness for *** {minute/hour:19697}.   Patient {ACTIONS;DENIES/REPORTS:21021675::"Denies"} nocturnal pain.  Difficulty dressing/grooming: {ACTIONS;DENIES/REPORTS:21021675::"Denies"} Difficulty climbing stairs: {ACTIONS;DENIES/REPORTS:21021675::"Denies"} Difficulty getting out of chair: {ACTIONS;DENIES/REPORTS:21021675::"Denies"} Difficulty using hands for taps, buttons, cutlery, and/or writing: {ACTIONS;DENIES/REPORTS:21021675::"Denies"}  No Rheumatology ROS completed.   PMFS History:  Patient Active Problem List   Diagnosis Date Noted   Rheumatoid arthritis involving multiple sites with positive rheumatoid factor ([place]) 02/23/2021   H/O bilateral hip replacements 02/23/2021   History of total left knee replacement 02/23/2021    Past Medical History:  Diagnosis Date   Guillain Barr syndrome ([place])    History of Graves' disease    Rheumatoid arthritis ([place])     Family History  Problem Relation Age of Onset   Cancer Mother    Healthy Daughter    Past Surgical History:  Procedure Laterality Date   APPENDECTOMY     REPLACEMENT TOTAL KNEE Left 2020   TOTAL HIP ARTHROPLASTY Right    TOTAL HIP ARTHROPLASTY Left    TUBAL LIGATION      Social History   Social History Narrative   Not on file   Immunization History  Administered Date(s) Administered   Moderna Sars-Covid-2 Vaccination 01/01/2020, 01/29/2020, 08/29/2020, 06/13/2021     Objective: Vital Signs: There were no vitals taken for this visit.   Physical Exam Vitals and nursing note reviewed.  Constitutional:      Appearance: She is well-developed.  HENT:     Head: Normocephalic and atraumatic.  Eyes:     Conjunctiva/sclera: Conjunctivae normal.  Cardiovascular:     Rate and Rhythm: Normal rate and regular rhythm.     Heart sounds: Normal heart sounds.  Pulmonary:     Effort: Pulmonary effort is normal.     Breath sounds: Normal breath sounds.  Abdominal:     General: Bowel sounds are normal.     Palpations: Abdomen is soft.  Musculoskeletal:     Cervical back: Normal range of motion.  Skin:    General: Skin is warm and dry.     Capillary Refill: Capillary refill takes less than 2 seconds.  Neurological:     Mental Status: She is alert and oriented to person, place, and time.  Psychiatric:        Behavior: Behavior normal.      Musculoskeletal Exam: ***  CDAI Exam: CDAI Score: -- Patient Global: --; Provider Global: -- Swollen: --; Tender: -- Joint Exam 11/02/2022   No joint exam has been documented for this visit   There is currently no information documented on the homunculus. Go to the Rheumatology activity and  complete the homunculus joint exam.  Investigation: No additional findings.  Imaging: No results found.  Recent Labs: Lab Results  Component Value Date   WBC 10.0 06/27/2022   HGB 14.0 06/27/2022   PLT 194 06/27/2022   NA 141 06/27/2022   K 4.0 06/27/2022   CL 106 06/27/2022   CO2 30 06/27/2022   GLUCOSE 120 (H) 06/27/2022   BUN 6 (L) 06/27/2022   CREATININE 0.72 06/27/2022   BILITOT 1.0 06/27/2022   ALKPHOS 72 06/27/2022   AST 20 06/27/2022   ALT 13 06/27/2022   PROT 6.6 06/27/2022   ALBUMIN 4.4  06/27/2022   CALCIUM 9.1 06/27/2022   GFRAA 101 02/23/2021   QFTBGOLDPLUS NEGATIVE 03/13/2022    Speciality Comments: dxd in [place] 17 years ago.  Treated with prednisone many years, Enbrel for 6 years off and on  Procedures:  No procedures performed Allergies: Latex   Assessment / Plan:     Visit Diagnoses: No diagnosis found.  Orders: No orders of the defined types were placed in this encounter.  No orders of the defined types were placed in this encounter.   Face-to-face time spent with patient was *** minutes. Greater than 50% of time was spent in counseling and coordination of care.  Follow-Up Instructions: No follow-ups on file.   Earnestine Mealing, CMA  Note - This record has been created using Editor, commissioning.  Chart creation errors have been sought, but may not always  have been located. Such creation errors do not reflect on  the standard of medical care.

## 2022-11-02 ENCOUNTER — Ambulatory Visit: Payer: Medicare PPO | Admitting: Physician Assistant

## 2022-11-02 DIAGNOSIS — Z96643 Presence of artificial hip joint, bilateral: Secondary | ICD-10-CM

## 2022-11-02 DIAGNOSIS — Z8709 Personal history of other diseases of the respiratory system: Secondary | ICD-10-CM

## 2022-11-02 DIAGNOSIS — M0609 Rheumatoid arthritis without rheumatoid factor, multiple sites: Secondary | ICD-10-CM

## 2022-11-02 DIAGNOSIS — M1711 Unilateral primary osteoarthritis, right knee: Secondary | ICD-10-CM

## 2022-11-02 DIAGNOSIS — Z8669 Personal history of other diseases of the nervous system and sense organs: Secondary | ICD-10-CM

## 2022-11-02 DIAGNOSIS — Z96652 Presence of left artificial knee joint: Secondary | ICD-10-CM

## 2022-11-02 DIAGNOSIS — Z79899 Other long term (current) drug therapy: Secondary | ICD-10-CM

## 2022-11-02 DIAGNOSIS — E0500 Thyrotoxicosis with diffuse goiter without thyrotoxic crisis or storm: Secondary | ICD-10-CM

## 2022-11-02 DIAGNOSIS — M19041 Primary osteoarthritis, right hand: Secondary | ICD-10-CM

## 2022-11-02 DIAGNOSIS — R748 Abnormal levels of other serum enzymes: Secondary | ICD-10-CM

## 2022-11-02 DIAGNOSIS — M19071 Primary osteoarthritis, right ankle and foot: Secondary | ICD-10-CM

## 2022-11-02 DIAGNOSIS — F17200 Nicotine dependence, unspecified, uncomplicated: Secondary | ICD-10-CM

## 2022-11-08 NOTE — Progress Notes (Unsigned)
Office Visit Note  Patient: Leslie Brewer             Date of Birth: 08-29-1961           MRN: [id number]             PCP: Roselee Nova, MD Referring: Roselee Nova, MD Visit Date: 11/22/2022 Occupation: @GUAROCC$ @  Subjective:  Pain in both hands   History of Present Illness: Alyza Kihm is a 62 y.o. female with history of seronegative rheumatoid arthritis and osteoarthritis.  Patient is currently on enbrel 50 mg sq injections every 7 days.  She has been tolerating Enbrel without any side effects or injection site reactions.  Patient reports that she underwent cataract surgery for both eyes in January/February 2024.  She states that she held Enbrel around the time of these surgeries.  She continues to have ongoing pain in both hands and the left knee replacement.  She continues to use a cane to assist with ambulation.  She has not been taking any over-the-counter products for pain relief since she finds them to be ineffective.  In the past she has tried tramadol but it did not seem to improve her pain level so she had discontinued.  Her quality of life continues to suffer due to the discomfort she experiences on a daily basis. She denies any new medical conditions.  She denies any recent or recurrent infections.  Activities of Daily Living:  Patient reports morning stiffness for 10 minutes.   Patient Reports nocturnal pain.  Difficulty dressing/grooming: Reports Difficulty climbing stairs: Reports Difficulty getting out of chair: Denies Difficulty using hands for taps, buttons, cutlery, and/or writing: Reports  Review of Systems  Constitutional:  Positive for fatigue.  HENT:  Positive for mouth sores and mouth dryness.   Eyes:  Positive for dryness.  Respiratory: Negative.  Negative for shortness of breath.   Cardiovascular: Negative.  Negative for chest pain and palpitations.  Gastrointestinal:  Positive for constipation. Negative for blood in stool and diarrhea.   Endocrine: Negative.  Negative for increased urination.  Genitourinary: Negative.  Negative for involuntary urination.  Musculoskeletal:  Positive for joint pain, gait problem, joint pain, muscle weakness and morning stiffness. Negative for joint swelling, myalgias, muscle tenderness and myalgias.  Skin: Negative.  Negative for color change, rash, hair loss and sensitivity to sunlight.  Allergic/Immunologic: Negative.  Negative for susceptible to infections.  Neurological:  Positive for dizziness. Negative for headaches.  Hematological: Negative.  Negative for swollen glands.  Psychiatric/Behavioral:  Positive for sleep disturbance. Negative for depressed mood. The patient is nervous/anxious.     PMFS History:  Patient Active Problem List   Diagnosis Date Noted   Rheumatoid arthritis involving multiple sites with positive rheumatoid factor ([place]) 02/23/2021   H/O bilateral hip replacements 02/23/2021   History of total left knee replacement 02/23/2021    Past Medical History:  Diagnosis Date   Guillain Barr syndrome ([place])    History of Graves' disease    Rheumatoid arthritis ([place])     Family History  Problem Relation Age of Onset   Cancer Mother    Healthy Daughter    Past Surgical History:  Procedure Laterality Date   APPENDECTOMY     CATARACT EXTRACTION Bilateral 2024   REPLACEMENT TOTAL KNEE Left 2020   TOTAL HIP ARTHROPLASTY Right    TOTAL HIP ARTHROPLASTY Left    TUBAL LIGATION     Social History   Social History Narrative  Not on file   Immunization History  Administered Date(s) Administered   Moderna Sars-Covid-2 Vaccination 01/01/2020, 01/29/2020, 08/29/2020, 06/13/2021     Objective: Vital Signs: BP 115/69 (BP Location: Left Arm, Patient Position: Sitting, Cuff Size: Normal)   Pulse 84   Resp 16   Ht 5' 7"$  (1.702 m)   Wt 154 lb 9.6 oz (70.1 kg)   BMI 24.21 kg/m    Physical Exam Vitals and nursing note reviewed.  Constitutional:      Appearance:  She is well-developed.  HENT:     Head: Normocephalic and atraumatic.  Eyes:     Conjunctiva/sclera: Conjunctivae normal.  Cardiovascular:     Rate and Rhythm: Normal rate and regular rhythm.     Heart sounds: Normal heart sounds.  Pulmonary:     Effort: Pulmonary effort is normal.     Breath sounds: Normal breath sounds.  Abdominal:     General: Bowel sounds are normal.     Palpations: Abdomen is soft.  Musculoskeletal:     Cervical back: Normal range of motion.  Skin:    General: Skin is warm and dry.     Capillary Refill: Capillary refill takes less than 2 seconds.  Neurological:     Mental Status: She is alert and oriented to person, place, and time.  Psychiatric:        Behavior: Behavior normal.      Musculoskeletal Exam: C-spine limited ROM with lateral rotation.  Painful ROM of lumbar spine.  Shoulder joints, elbow joints, wrist joints, MCPs, PIPs, and DIPs good ROM with no synovitis.  Complete fist formation bilaterally.  Hip replacements have slightly limited range of motion.  Left knee replacement has limited extension with discomfort.  Right knee joint has good range of motion with no warmth or effusion.  Ankle joints have good range of motion with no tenderness or joint swelling.  CDAI Exam: CDAI Score: -- Patient Global: 5 mm; Provider Global: 5 mm Swollen: --; Tender: -- Joint Exam 11/22/2022   No joint exam has been documented for this visit   There is currently no information documented on the homunculus. Go to the Rheumatology activity and complete the homunculus joint exam.  Investigation: No additional findings.  Imaging: No results found.  Recent Labs: Lab Results  Component Value Date   WBC 10.0 06/27/2022   HGB 14.0 06/27/2022   PLT 194 06/27/2022   NA 141 06/27/2022   K 4.0 06/27/2022   CL 106 06/27/2022   CO2 30 06/27/2022   GLUCOSE 120 (H) 06/27/2022   BUN 6 (L) 06/27/2022   CREATININE 0.72 06/27/2022   BILITOT 1.0 06/27/2022    ALKPHOS 72 06/27/2022   AST 20 06/27/2022   ALT 13 06/27/2022   PROT 6.6 06/27/2022   ALBUMIN 4.4 06/27/2022   CALCIUM 9.1 06/27/2022   GFRAA 101 02/23/2021   QFTBGOLDPLUS NEGATIVE 03/13/2022    Speciality Comments: dxd in [place] 17 years ago.  Treated with prednisone many years, Enbrel for 6 years off and on  Procedures:  No procedures performed Allergies: Latex    Assessment / Plan:     Visit Diagnoses: Rheumatoid arthritis of multiple sites with negative rheumatoid factor ([facility]) - - RF negative, anti-CCP negative, positive ANA. Dxd in [place].  Treated with prednisone for many years and then Enbrel off and on for the last 6 years: She has no synovitis on examination today.  She remains on Enbrel 50 mg subcutaneous injections once weekly.  She continues to have  persistent pain in her right hand and right wrist joint.  It is unclear if her symptoms are due to previous damage or osteoarthritis.  Her pain level has not been adequately controlled.  Discussed a referral to pain management since over-the-counter products have not provided relief in the past.  Discussed the list of natural anti-inflammatories at her last office visit which she plans on adding on.  She will remain on Enbrel as prescribed.  She was advised to notify us if she develops increased joint swelling.  She will follow-up in the office in 3 months or sooner if needed.- Plan: Ambulatory referral to Physical Medicine Rehab  High risk medication use - Enbrel 50 mg sq injections once weekly.  CBC and CMP updated on 06/27/22. Orders for CBC and CMP released today. Her next lab work will be due in June and every 3 months. Standing orders for CBC and CMP remain in place.    TB gold negative on 03/13/22. Future order for TB gold placed today.  No recent or recurrent infections. Discussed the importance of holding enbrel if she develops signs or symptoms of an infection and to resume once the infection has completely cleared.  - Plan: COMPLETE  METABOLIC PANEL WITH GFR, CBC with Differential/Platelet, QuantiFERON-TB Gold Plus  Screening for tuberculosis -Future order for TB Gold placed today.  Plan: QuantiFERON-TB Gold Plus  Elevated CK: CK was 141 on 04/13/2021. No muscle weakness.   Primary osteoarthritis of both hands - She continues to have chronic pain in both hands.  No active inflammation was noted today.  Her rheumatoid arthritis continues to be well-controlled on Enbrel as prescribed.  Most of her discomfort seems to be due to underlying osteoarthritis or from possible previous damage.  At her last office visit the use of natural anti-inflammatories was discussed.  She has not yet tried the supplements due to having undergo cataract surgery for both eyes.  Discussed the use of arthritis compression gloves which can be helpful.  If she notices more joint swelling she was advised to notify us and we can schedule an ultrasound to assess for synovitis.   Plan to place a referral for pain management as requested.  Plan: Ambulatory referral to Physical Medicine Rehab  H/O bilateral hip replacements - Doing well overall. Slightly limited ROM. plan: Ambulatory referral to Physical Medicine Rehab  Primary osteoarthritis of right knee - She has good ROM of the right knee with no discomfort at this time.  No warmth or effusion noted today.   History of total left knee replacement: Chronic pain.  Limited extension.  Limited mobility due to the severity of pain and stiffness.  Using a cane to assist with ambulation.  A referral to pain management was placed today. Plan: Ambulatory referral to Physical Medicine Rehab  Primary osteoarthritis of both feet - She is wearing proper fitting shoes.  Plan: Ambulatory referral to Physical Medicine Rehab  Other medical conditions are listed as follows:   Graves' ophthalmopathy  History of COPD  H/O Guillain-Barre syndrome  Smoker   Orders: Orders Placed This Encounter  Procedures   COMPLETE  METABOLIC PANEL WITH GFR   CBC with Differential/Platelet   QuantiFERON-TB Gold Plus   Ambulatory referral to Physical Medicine Rehab   No orders of the defined types were placed in this encounter.   Follow-Up Instructions: Return in about 3 months (around 02/20/2023) for Rheumatoid arthritis, Osteoarthritis.   Ofilia Neas, PA-C  Note - This record has been created using  Editor, commissioning.  Chart creation errors have been sought, but may not always  have been located. Such creation errors do not reflect on  the standard of medical care.

## 2022-11-22 ENCOUNTER — Ambulatory Visit: Payer: Medicare PPO | Attending: Physician Assistant | Admitting: Physician Assistant

## 2022-11-22 ENCOUNTER — Telehealth: Payer: Self-pay | Admitting: Pharmacist

## 2022-11-22 ENCOUNTER — Other Ambulatory Visit (HOSPITAL_COMMUNITY): Payer: Self-pay

## 2022-11-22 ENCOUNTER — Encounter: Payer: Self-pay | Admitting: Physician Assistant

## 2022-11-22 VITALS — BP 115/69 | HR 84 | Resp 16 | Ht 67.0 in | Wt 154.6 lb

## 2022-11-22 DIAGNOSIS — M0609 Rheumatoid arthritis without rheumatoid factor, multiple sites: Secondary | ICD-10-CM

## 2022-11-22 DIAGNOSIS — E0500 Thyrotoxicosis with diffuse goiter without thyrotoxic crisis or storm: Secondary | ICD-10-CM

## 2022-11-22 DIAGNOSIS — M19072 Primary osteoarthritis, left ankle and foot: Secondary | ICD-10-CM

## 2022-11-22 DIAGNOSIS — Z79899 Other long term (current) drug therapy: Secondary | ICD-10-CM | POA: Diagnosis not present

## 2022-11-22 DIAGNOSIS — Z111 Encounter for screening for respiratory tuberculosis: Secondary | ICD-10-CM

## 2022-11-22 DIAGNOSIS — Z96643 Presence of artificial hip joint, bilateral: Secondary | ICD-10-CM

## 2022-11-22 DIAGNOSIS — M19042 Primary osteoarthritis, left hand: Secondary | ICD-10-CM

## 2022-11-22 DIAGNOSIS — Z8709 Personal history of other diseases of the respiratory system: Secondary | ICD-10-CM

## 2022-11-22 DIAGNOSIS — Z8669 Personal history of other diseases of the nervous system and sense organs: Secondary | ICD-10-CM

## 2022-11-22 DIAGNOSIS — M19071 Primary osteoarthritis, right ankle and foot: Secondary | ICD-10-CM

## 2022-11-22 DIAGNOSIS — Z96652 Presence of left artificial knee joint: Secondary | ICD-10-CM

## 2022-11-22 DIAGNOSIS — H05839 Thyroid orbitopathy, unspecified orbit: Secondary | ICD-10-CM

## 2022-11-22 DIAGNOSIS — M19041 Primary osteoarthritis, right hand: Secondary | ICD-10-CM | POA: Diagnosis not present

## 2022-11-22 DIAGNOSIS — R748 Abnormal levels of other serum enzymes: Secondary | ICD-10-CM

## 2022-11-22 DIAGNOSIS — M1711 Unilateral primary osteoarthritis, right knee: Secondary | ICD-10-CM

## 2022-11-22 DIAGNOSIS — F17200 Nicotine dependence, unspecified, uncomplicated: Secondary | ICD-10-CM

## 2022-11-22 NOTE — Telephone Encounter (Signed)
Approval letter for Enbrel from [facility] received - retained in Onbase for printing when PAP is ready for submission  Knox Saliva, PharmD, MPH, BCPS, CPP Clinical Pharmacist (Rheumatology and Pulmonology)

## 2022-11-22 NOTE — Telephone Encounter (Signed)
Patient had OV today and stated she needs to reapply for Enbrel PAP. Called patient and advised of application that will need to be completed.  Left VM advising her to stop by clinic to complete or if she'd like for [place] to mail it to her, she can call us back. Patient portion application has been placed up front.   Provider portion placed in Hazel Sams, PA-C's folder for signature with insurance card copy, and med list.  PA submitted via CMM: [id number].  Per automated response: Authorization already on file for this request. Authorization starting on 09/24/2020 and ending on 09/24/2023 (EOC ID # [id number]). Called [employer] for PA approval letter to be faxed to clinic to attach to PAP application  Knox Saliva, PharmD, MPH, BCPS, CPP Clinical Pharmacist (Rheumatology and Pulmonology)

## 2022-11-22 NOTE — Patient Instructions (Signed)
Standing Labs We placed an order today for your standing lab work.   Please have your standing labs drawn in June and every 3 months   Please have your labs drawn 2 weeks prior to your appointment so that the provider can discuss your lab results at your appointment, if possible.  Please note that you may see your imaging and lab results in [place] before we have reviewed them. We will contact you once all results are reviewed. Please allow our office up to 72 hours to thoroughly review all of the results before contacting the office for clarification of your results.  WALK-IN LAB HOURS  Monday through Thursday from 8:00 am -12:30 pm and 1:00 pm-5:00 pm and Friday from 8:00 am-12:00 pm.  Patients with office visits requiring labs will be seen before walk-in labs.  You may encounter longer than normal wait times. Please allow additional time. Wait times may be shorter on  Monday and Thursday afternoons.  We do not book appointments for walk-in labs. We appreciate your patience and understanding with our staff.   Labs are drawn by [facility]. Please bring your co-pay at the time of your lab draw.  You may receive a bill from [facility] for your lab work.  Please note if you are on Hydroxychloroquine and and an order has been placed for a Hydroxychloroquine level,  you will need to have it drawn 4 hours or more after your last dose.  If you wish to have your labs drawn at another location, please call the office 24 hours in advance so we can fax the orders.  The office is located at [address] [place], [place], [place] [address]   If you have any questions regarding directions or hours of operation,  please call [phone].   As a reminder, please drink plenty of water prior to coming for your lab work. Thanks!  If you have signs or symptoms of an infection or start antibiotics: First, call your PCP for workup of your infection. Hold your medication through the infection, until you  complete your antibiotics, and until symptoms resolve if you take the following: Injectable medication (Actemra, Benlysta, Cimzia, Cosentyx, Enbrel, Humira, Kevzara, Orencia, Remicade, Simponi, Stelara, Taltz, Tremfya) Methotrexate Leflunomide (Arava) Mycophenolate (Cellcept) Morrie Sheldon, Olumiant, or Rinvoq Vaccines You are taking a medication(s) that can suppress your immune system.  The following immunizations are recommended: Flu annually Covid-19  Td/Tdap (tetanus, diphtheria, pertussis) every 10 years Pneumonia (Prevnar 15 then Pneumovax 23 at least 1 year apart.  Alternatively, can take Prevnar 20 without needing additional dose) Shingrix: 2 doses from 4 weeks to 6 months apart  Please check with your PCP to make sure you are up to date.

## 2022-11-23 LAB — COMPLETE METABOLIC PANEL WITH GFR
AG Ratio: 1.7 (calc) (ref 1.0–2.5)
ALT: 13 U/L (ref 6–29)
AST: 21 U/L (ref 10–35)
Albumin: 4.6 g/dL (ref 3.6–5.1)
Alkaline phosphatase (APISO): 82 U/L (ref 37–153)
BUN/Creatinine Ratio: 8 (calc) (ref 6–22)
BUN: 6 mg/dL — ABNORMAL LOW (ref 7–25)
CO2: 26 mmol/L (ref 20–32)
Calcium: 9.4 mg/dL (ref 8.6–10.4)
Chloride: 103 mmol/L (ref 98–110)
Creat: 0.77 mg/dL (ref 0.50–1.05)
Globulin: 2.7 g/dL (calc) (ref 1.9–3.7)
Glucose, Bld: 81 mg/dL (ref 65–99)
Potassium: 4.2 mmol/L (ref 3.5–5.3)
Sodium: 141 mmol/L (ref 135–146)
Total Bilirubin: 0.4 mg/dL (ref 0.2–1.2)
Total Protein: 7.3 g/dL (ref 6.1–8.1)
eGFR: 87 mL/min/{1.73_m2} (ref >=60)

## 2022-11-23 LAB — CBC WITH DIFFERENTIAL/PLATELET
Absolute Monocytes: 928 cells/uL (ref 200–950)
Basophils Absolute: 71 cells/uL (ref 0–200)
Basophils Relative: 0.6 %
Eosinophils Absolute: 71 cells/uL (ref 15–500)
Eosinophils Relative: 0.6 %
HCT: 41.8 % (ref 35.0–45.0)
Hemoglobin: 13.9 g/dL (ref 11.7–15.5)
Lymphs Abs: 3368 cells/uL (ref 850–3900)
MCH: 30 pg (ref 27.0–33.0)
MCHC: 33.3 g/dL (ref 32.0–36.0)
MCV: 90.3 fL (ref 80.0–100.0)
MPV: 11.2 fL (ref 7.5–12.5)
Monocytes Relative: 7.8 %
Neutro Abs: 7461 cells/uL (ref 1500–7800)
Neutrophils Relative %: 62.7 %
Platelets: 200 10*3/uL (ref 140–400)
RBC: 4.63 10*6/uL (ref 3.80–5.10)
RDW: 13.1 % (ref 11.0–15.0)
Total Lymphocyte: 28.3 %
WBC: 11.9 10*3/uL — ABNORMAL HIGH (ref 3.8–10.8)

## 2022-11-23 NOTE — Progress Notes (Signed)
CBC and CMP are stable.

## 2022-11-26 NOTE — Telephone Encounter (Signed)
Received signed provider portion for [employer] PAP renewal for Enbrel. Spoke with pt-  she will stop by clinic to sign her portion of forms. It is already in file cabinet up front  Knox Saliva, PharmD, MPH, BCPS, CPP Clinical Pharmacist (Rheumatology and Pulmonology)

## 2022-12-03 NOTE — Telephone Encounter (Signed)
Received signed patient portion. Submitted Patient Assistance Application to [employer] for ENBREL along with provider portion, pt portion, med list, insurance card copy. Will update patient when we receive a response.  Fax# [phone] Phone# [phone]  Knox Saliva, PharmD, MPH, BCPS, CPP Clinical Pharmacist (Rheumatology and Pulmonology)

## 2022-12-11 ENCOUNTER — Encounter: Payer: Self-pay | Admitting: Physical Medicine and Rehabilitation

## 2022-12-18 NOTE — Telephone Encounter (Signed)
Received a fax from  [place] regarding an approval for ENBREL patient assistance from 12/18/22 to 09/24/23. Approval letter sent to scan center.  Phone #: [phone] Fax #: [phone]  Knox Saliva, PharmD, MPH, BCPS, CPP Clinical Pharmacist (Rheumatology and Pulmonology)

## 2022-12-24 ENCOUNTER — Encounter: Payer: Medicare PPO | Attending: Physical Medicine and Rehabilitation | Admitting: Physical Medicine and Rehabilitation

## 2022-12-24 ENCOUNTER — Encounter: Payer: Self-pay | Admitting: Physical Medicine and Rehabilitation

## 2022-12-24 VITALS — BP 139/76 | HR 69 | Ht 67.0 in | Wt 162.2 lb

## 2022-12-24 DIAGNOSIS — H8112 Benign paroxysmal vertigo, left ear: Secondary | ICD-10-CM | POA: Diagnosis present

## 2022-12-24 MED ORDER — DICLOFENAC SODIUM 50 MG PO TBEC: 50.0000 mg | DELAYED_RELEASE_TABLET | Freq: Every day | ORAL | 5 refills | Status: DC | PRN

## 2022-12-24 MED ORDER — CITALOPRAM HYDROBROMIDE 20 MG PO TABS: 20.0000 mg | ORAL_TABLET | Freq: Every day | ORAL | 5 refills | Status: DC

## 2022-12-24 NOTE — Patient Instructions (Addendum)
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 -   Here for evaluation of chronic pain.   Referral to Neurorehab- PT vertigo evaluation- worse on L side than R side-had for years, never evaluated.    2.  Tumeric-  suggest 500 mg 2x/day pills- is over the counter- can order online  3.  Doesn't want to take Prednisone/any steroids.    4.  Will try Celexa- 20 mg daily- for mood- for major depression. Takes 2-4 weeks to start to kick in- and the energy comes back before mood usually.   5.  Will try Diclofenac 50 mg daily as needed- is an anti-inflammatory- (an NSAID)-  can try a higher dose if a little helpful, but not enough.Take with food so doesn't upset your stomach. No hx of GI bleed/ulcers. Cr looks great- no CKD.    6. - call me in 4-6 weeks to let me know how things going; call me earlier if any side effects  7. Tennis ball- hold pressure for 2-4 minutes spot bothering you- we can get those muscles to relax some.  NO MASSAGE- can get massages but don't massage yourself.  To relax shoulder and upper/mid back muscles.  Can do a trigger point injections if need be at f/u.   8. F/U in 3 months, but call at 4-6 weeks to let me know how things going.   9. Before try opiates will try prescription strength, but not opiates/narcotics first.

## 2022-12-24 NOTE — Progress Notes (Signed)
Subjective:    Patient ID: Leslie Brewer, female    DOB: 1961/09/05, 62 y.o.   MRN: [id number]  HPI  Pt is a 62 yr old with hx of Seronegative RA and OA, B/L THR's, L TKR, Also has hx of Grave's disease- also has double vision due to Grave's disease-  and GBS- 10 years ago; Also has vertigo- dizzy all the time. -   Here for evaluation of chronic pain.   Also had B/L cataracts removed  GBS 10 years ago- mouth is "Crooked"- sometimes has some nerve pain-  Fingertips feel numbs- uses a computer a lot.  No other places that has N/T- used to hurt, but not really anymore.  Used to do hot/cold to treat, but doesn't need anymore.   Also has some dizziness- was dx'd with vertigo. Never was evaluated with PT- if sleeps on L side, feels like falling.  If turns head to L or lays on L side, Sx's much worse.  Has to literally hold on.    Hurts more in AM and late evening- takes meds usually in AM.  Sleeps poorly, but having melatonin- had some hallucinations with it. Scared of it.    Pain she has in mainly in joints- but feels like not listened to, since they only notice if has swelling.   Hand pain/joints/wrists;  hurt more than ankles swelling/pain.  B/L hips and B/L knees   Uses a cane all the time- doesn't know when legs will give out- especially knees.   Has chronic constipation-   Tried:  Tramadol- 50 mg- maybe 2x/day- said weren't helpful- makes her constipated Tylenol- not real helpful- when takes- takes 2- 4 pills/day.  Was told couldn't take Advil-  Tried Tumeric tea, not pills- got it loose-   L leg is bowed- and Has the L TKR done "wrong". - since done in cement, really hard to change- L knee actually huts moe than R side.    Depressed most of time- used to be on something, but not lately. Didn't feel anything on medicine- was "numb".  Back in 1980s- doesn't know if was on Prozac or TCA's  Social Hx:  Moved here when no one/friends weren't here- and no family Made  a house offer in [place]-    Pain Inventory Average Pain 8 Pain Right Now 6 My pain is sharp, stabbing, tingling, and aching  In the last 24 hours, has pain interfered with the following? General activity 5 Relation with others 5 Enjoyment of life 5 What TIME of day is your pain at its worst? morning  and night Sleep (in general) Poor  Pain is worse with: walking, inactivity, standing, and some activites Pain improves with: pacing activities Relief from Meds: 4  use a cane how many minutes can you walk? 5 ability to climb steps?  no do you drive?  no  disabled: date disabled 2017 I need assistance with the following:  meal prep, household duties, and shopping  weakness numbness tingling trouble walking dizziness depression  Any changes since last visit?  no  Rheumatologist Devashwar    Family History  Problem Relation Age of Onset   Cancer Mother    Healthy Daughter    Social History   Socioeconomic History   Marital status: Divorced    Spouse name: Not on file   Number of children: Not on file   Years of education: Not on file   Highest education level: Not on file  Occupational History  Not on file  Tobacco Use   Smoking status: Every Day    Packs/day: 1.00    Years: 30.00    Additional pack years: 0.00    Total pack years: 30.00    Types: Cigarettes    Passive exposure: Never   Smokeless tobacco: Never  Vaping Use   Vaping Use: Never used  Substance and Sexual Activity   Alcohol use: Yes    Comment: occ   Drug use: Not Currently   Sexual activity: Not on file  Other Topics Concern   Not on file  Social History Narrative   Not on file   Social Determinants of Health   Financial Resource Strain: Not on file  Food Insecurity: Not on file  Transportation Needs: Not on file  Physical Activity: Not on file  Stress: Not on file  Social Connections: Not on file   Past Surgical History:  Procedure Laterality Date   APPENDECTOMY      CATARACT EXTRACTION Bilateral 2024   REPLACEMENT TOTAL KNEE Left 2020   TOTAL HIP ARTHROPLASTY Right    TOTAL HIP ARTHROPLASTY Left    TUBAL LIGATION     Past Medical History:  Diagnosis Date   Guillain Barr syndrome    History of Graves' disease    Rheumatoid arthritis    BP 139/76   Pulse 69   Ht 5\' 7"  (1.702 m)   Wt 162 lb 3.2 oz (73.6 kg)   SpO2 98%   BMI 25.40 kg/m   Opioid Risk Score:   Fall Risk Score:  `1  Depression screen [facility] 2/9     12/24/2022    1:19 PM  Depression screen PHQ 2/9  Decreased Interest 2  Down, Depressed, Hopeless 2  PHQ - 2 Score 4  Altered sleeping 3  Tired, decreased energy 2  Change in appetite 2  Feeling bad or failure about yourself  3  Trouble concentrating 1  Moving slowly or fidgety/restless 0  Suicidal thoughts 2  PHQ-9 Score 17  Difficult doing work/chores Somewhat difficult    Review of Systems  Constitutional:  Positive for diaphoresis.  HENT: Negative.    Eyes: Negative.   Respiratory:  Positive for wheezing.   Cardiovascular: Negative.   Gastrointestinal:  Positive for constipation.  Endocrine: Negative.   Genitourinary: Negative.   Musculoskeletal:  Positive for arthralgias and gait problem.  Skin:  Positive for rash.  Allergic/Immunologic: Negative.   Neurological:  Positive for dizziness, weakness and numbness.  Hematological: Negative.   Psychiatric/Behavioral:  Positive for dysphoric mood.   All other systems reviewed and are negative.      Objective:   Physical Exam  Awake, alert, appropriate, tearful at times, NAD MS: swelling in R medial wrist as well as B/L 2nd-3rd MCPs- and B/L ankle swelling- 1+ LE edema- to ankles B/L   Strength 5/5 in Ue's and LE's however having pain esp with B/L knee and hip and hand ROM  Has some myofascial trigger point injections- B/L upper traps, B/L levators and scalenes- and rhomboids         Assessment & Plan:   Pt is a 62 yr old with hx of Seronegative RA and  OA, B/L THR's, L TKR, Also has hx of Grave's disease- also has double vision due to Grave's disease-  and GBS- 10 years ago; Also has vertigo- dizzy all the time. -   Here for evaluation of chronic pain.   Referral to Neurorehab- PT vertigo evaluation- worse on L side  than R side-had for years, never evaluated.    2.  Tumeric-  suggest 500 mg 2x/day pills- is over the counter- can order online  3.  Doesn't want to take Prednisone/any steroids.    4.  Will try Celexa- 20 mg daily- for mood- for major depression. Takes 2-4 weeks to start to kick in- and the energy comes back before mood usually.   5.  Will try Diclofenac 50 mg daily as needed- is an anti-inflammatory- (an NSAID)-  can try a higher dose if a little helpful, but not enough.Take with food so doesn't upset your stomach. No hx of GI bleed/ulcers. Cr looks great- no CKD.    6. - call me in 4-6 weeks to let me know how things going; call me earlier if any side effects  7. Tennis ball- hold pressure for 2-4 minutes spot bothering you- we can get those muscles to relax some.  NO MASSAGE- can get massages but don't massage yourself.  To relax shoulder and upper/mid back muscles.  Can do a trigger point injections if need be at f/u.   8. F/U in 3 months, but call at 4-6 weeks to let me know how things going.   9. Before try opiates will try prescription strength, but not opiates/narcotics first.    I spent a total of  48  minutes on total care today- >50% coordination of care- due to discussing myofascial pain; joint pain and how to manage and depression and discussing options.

## 2022-12-26 ENCOUNTER — Ambulatory Visit: Payer: Medicare PPO | Attending: Physical Medicine and Rehabilitation

## 2022-12-26 DIAGNOSIS — R2681 Unsteadiness on feet: Secondary | ICD-10-CM | POA: Diagnosis present

## 2022-12-26 DIAGNOSIS — R42 Dizziness and giddiness: Secondary | ICD-10-CM

## 2022-12-26 DIAGNOSIS — H8112 Benign paroxysmal vertigo, left ear: Secondary | ICD-10-CM | POA: Insufficient documentation

## 2022-12-26 NOTE — Therapy (Signed)
OUTPATIENT PHYSICAL THERAPY VESTIBULAR EVALUATION     Patient Name: Leslie Brewer MRN: [id number] DOB:1961/08/03, 62 y.o., female Today's Date: 12/26/2022  END OF SESSION:  PT End of Session - 12/26/22 0956     Visit Number 1    Number of Visits 1    Authorization Type [employer]    PT Start Time [id number]    PT Stop Time 1058    PT Time Calculation (min) 50 min    Activity Tolerance Patient tolerated treatment well    Behavior During Therapy WFL for tasks assessed/performed             Past Medical History:  Diagnosis Date   Guillain Barr syndrome    History of Graves' disease    Rheumatoid arthritis    Past Surgical History:  Procedure Laterality Date   APPENDECTOMY     CATARACT EXTRACTION Bilateral 2024   REPLACEMENT TOTAL KNEE Left 2020   TOTAL HIP ARTHROPLASTY Right    TOTAL HIP ARTHROPLASTY Left    TUBAL LIGATION     Patient Active Problem List   Diagnosis Date Noted   Rheumatoid arthritis involving multiple sites with positive rheumatoid factor 02/23/2021   H/O bilateral hip replacements 02/23/2021   History of total left knee replacement 02/23/2021    PCP: Rochel Brome, MD REFERRING PROVIDER:   Courtney Heys, MD    REFERRING DIAG: [id number] (ICD-10-CM) - Benign paroxysmal positional vertigo of left ear   THERAPY DIAG:  Dizziness and giddiness  Unsteadiness on feet  ONSET DATE:   12/24/2022  referral  Rationale for Evaluation and Treatment: Rehabilitation  SUBJECTIVE:   SUBJECTIVE STATEMENT: Patient arrives to clinic alone, ambulating with SPC intermittently. Reports being dizzy just sitting here. Intermittent dizziness, mostly with L side, but sometimes R. Does have "vision issues" (Grave's in 1 eye, recent cataract sx). Has vertical diplopia all the time. Dizziness has been going on at least 10 years. Had GBS then. Lying down seems to be the worst position for her dizziness. Also has not eaten yesterday or today and contributes that to  her dizziness today. Reports vertical diplopia with head upright, but horizontal diplopia with sidelying.  Pt accompanied by: self  PERTINENT HISTORY: Seronegative RA and OA, B/L THR's, L TKR, Also has hx of Grave's disease- also has double vision due to Grave's disease- and GBS- 10 years ago  PAIN:  Are you having pain?  Baseline arthritis pains  PRECAUTIONS: Fall  WEIGHT BEARING RESTRICTIONS: No  FALLS: Has patient fallen in last 6 months? No  LIVING ENVIRONMENT: Lives with: lives alone Lives in: House/apartment Stairs: No Has following equipment at home: Environmental consultant - 2 wheeled  PLOF: Independent and Requires assistive device for independence  PATIENT GOALS: "I would like to be able to sleep and not be dizzy:  OBJECTIVE:   COGNITION: Overall cognitive status: Within functional limits for tasks assessed   SENSATION: Fingers, part of her face due to GBS  POSTURE:  No Significant postural limitations  Cervical ROM:   Grossly limited due to RA Reports increase in diplopia with VBI testing  STRENGTH: WFL  BED MOBILITY:  Able to complete independently, but does report some dizziness  TRANSFERS: Assistive device utilized: Single point cane  Sit to stand: Modified independence Stand to sit: Modified independence Chair to chair: Modified independence  GAIT: Gait pattern: step through pattern, decreased arm swing- Right, decreased arm swing- Left, antalgic, and wide BOS Distance walked: clinic  PATIENT SURVEYS:  FOTO 46; expected  to be at 60  VESTIBULAR ASSESSMENT:  GENERAL OBSERVATION: NAD, ambulates with SPC   SYMPTOM BEHAVIOR:  Subjective history: see above  Non-Vestibular symptoms:  has diplopia at baseline  Type of dizziness: Spinning/Vertigo  Frequency: "sporadic"  Duration: won't stop until she moves out of the position that provoked it   Aggravating factors: Spontaneous, Induced by position change: lying supine, rolling to the right, and rolling to the  left, Induced by motion: turning body quickly and turning head quickly, Occurs when standing still , and when sleeping  Relieving factors: no known relieving factors  Progression of symptoms: worse  OCULOMOTOR EXAM:  Ocular Alignment: normal  Ocular ROM: No Limitations  Spontaneous Nystagmus: absent  Gaze-Induced Nystagmus: absent  Smooth Pursuits: saccades  Saccades: intact  VESTIBULAR - OCULAR REFLEX:   Slow VOR: Comment: slow  VOR Cancellation: Unable to Maintain Gaze; increase in symptoms reported  Head-Impulse Test: HIT Right: positive HIT Left: positive  Test of skew: B (+)    POSITIONAL TESTING: Right Roll Test: no nystagmus and improvement in double vision Left Roll Test: no nystagmus and worsening of double vision Right Sidelying: no nystagmus Left Sidelying: no nystagmus  MOTION SENSITIVITY:  Motion Sensitivity Quotient Intensity: 0 = none, 1 = Lightheaded, 2 = Mild, 3 = Moderate, 4 = Severe, 5 = Vomiting  Intensity  1. Sitting to supine 3  2. Supine to L side 3  3. Supine to R side 3  4. Supine to sitting 3  5. L Hallpike-Dix   6. Up from L    7. R Hallpike-Dix   8. Up from R    9. Sitting, head tipped to L knee   10. Head up from L knee   11. Sitting, head tipped to R knee   12. Head up from R knee   13. Sitting head turns x5   14.Sitting head nods x5   15. In stance, 180 turn to L    16. In stance, 180 turn to R    VESTIBULAR TREATMENT:                                                                                                   N/A eval  PATIENT EDUCATION: Education details: PT POC, exam findings Person educated: Patient Education method: Explanation Education comprehension: verbalized understanding and needs further education  HOME EXERCISE PROGRAM:  GOALS: Not written as PT is not indicated at this time  ASSESSMENT:  CLINICAL IMPRESSION: Patient is a 62 y.o. female who was seen today for physical therapy evaluation and treatment for  chronic dizziness.Patients vestibular exam is complicated by known history of of diplopia, which she has experienced for ~10 years without correction. She does have an eye dr appt later this month to trial prisms, per patient. Vestibular exam positive for impaired VOR, VOR cancellation, saccadic eye movement with smooth pursuits, (+) B HIT and (+) B test of skew. All of these findings are indicative of a central dysfunction; however, the picture is significantly complicated by patients diplopia. PT unable to determine how much of patients exam is eye  movement to search for singular target vs truly impaired VOR cancellation, for example. Given current exam findings, patient is not appropriate for PT. She may return for further vestibular evaluation once double vision is correct to assure accuracy of exam components and possible work up from neurology.    CLINICAL DECISION MAKING: Unstable/unpredictable  EVALUATION COMPLEXITY: High   PLAN:  PT FREQUENCY: one time visit  PT DURATION: other: 1x visit   Debbora Dus, PT, DPT, CBIS 12/26/2022, 11:23 AM

## 2023-02-06 NOTE — Progress Notes (Signed)
Office Visit Note  Patient: Leslie Brewer             Date of Birth: 04-21-1961           MRN: [id number]             PCP: Ellyn Hack, MD Referring: Ellyn Hack, MD Visit Date: 02/20/2023 Occupation: @GUAROCC @  Subjective:  Medication monitoring   History of Present Illness: Leslie Brewer is a 62 y.o. female with history of seronegative rheumatoid arthritis and osteoarthritis.  Patient remains on Enbrel 50 mg sq injections once weekly.  She is tolerating Enbrel without any side effects or injection site reactions.  Patient reports that she establish care with Dr. Berline Chough for pain management and was given a prescription for diclofenac 50 mg 1 tablet by mouth daily as needed for pain relief as well as started on Celexa 20 mg daily.  She has not been taking diclofenac consistently.  She tried taking Celexa but experienced excessive daytime drowsiness.  She plans on retrying Celexa at bedtime.  She continues to experience intermittent arthralgias especially in both hands.  She has occasional nocturnal pain but states usually her pain level depends on her activity level.  She denies any joint swelling at this time. She denies any recent or recurrent infections. She has been working on tobacco cessation   Activities of Daily Living:  Patient reports morning stiffness for 1 hour.   Patient Reports nocturnal pain.  Difficulty dressing/grooming: Reports Difficulty climbing stairs: Reports Difficulty getting out of chair: Denies Difficulty using hands for taps, buttons, cutlery, and/or writing: Reports  Review of Systems  Constitutional:  Positive for fatigue.  HENT:  Negative for mouth sores and mouth dryness.   Eyes:  Positive for dryness.  Respiratory:  Negative for shortness of breath.   Cardiovascular:  Negative for chest pain and palpitations.  Gastrointestinal:  Positive for constipation. Negative for blood in stool and diarrhea.  Endocrine: Negative for increased  urination.  Genitourinary:  Negative for involuntary urination.  Musculoskeletal:  Positive for joint pain, gait problem, joint pain and morning stiffness. Negative for joint swelling, myalgias, muscle weakness, muscle tenderness and myalgias.  Skin:  Positive for hair loss and sensitivity to sunlight. Negative for color change and rash.  Allergic/Immunologic: Negative for susceptible to infections.  Neurological:  Positive for dizziness. Negative for headaches.  Hematological:  Negative for swollen glands.  Psychiatric/Behavioral:  Positive for depressed mood and sleep disturbance. The patient is nervous/anxious.     PMFS History:  Patient Active Problem List   Diagnosis Date Noted   Rheumatoid arthritis involving multiple sites with positive rheumatoid factor (HCC) 02/23/2021   H/O bilateral hip replacements 02/23/2021   History of total left knee replacement 02/23/2021    Past Medical History:  Diagnosis Date   Guillain Barr syndrome (HCC)    History of Graves' disease    Rheumatoid arthritis (HCC)     Family History  Problem Relation Age of Onset   Cancer Mother    Healthy Daughter    Past Surgical History:  Procedure Laterality Date   APPENDECTOMY     CATARACT EXTRACTION Bilateral 2024   REPLACEMENT TOTAL KNEE Left 2020   TOTAL HIP ARTHROPLASTY Right    TOTAL HIP ARTHROPLASTY Left    TUBAL LIGATION     Social History   Social History Narrative   Not on file   Immunization History  Administered Date(s) Administered   Moderna Sars-Covid-2 Vaccination 01/01/2020,  01/29/2020, 08/29/2020, 06/13/2021     Objective: Vital Signs: BP (!) 146/103 (BP Location: Left Arm, Patient Position: Sitting, Cuff Size: Normal)   Pulse 62   Resp 16   Ht 5\' 7"  (1.702 m)   Wt 160 lb (72.6 kg)   BMI 25.06 kg/m    Physical Exam Vitals and nursing note reviewed.  Constitutional:      Appearance: She is well-developed.  HENT:     Head: Normocephalic and atraumatic.  Eyes:      Conjunctiva/sclera: Conjunctivae normal.  Cardiovascular:     Rate and Rhythm: Normal rate and regular rhythm.     Heart sounds: Normal heart sounds.  Pulmonary:     Effort: Pulmonary effort is normal.     Breath sounds: Normal breath sounds.  Abdominal:     General: Bowel sounds are normal.     Palpations: Abdomen is soft.  Musculoskeletal:     Cervical back: Normal range of motion.  Lymphadenopathy:     Cervical: No cervical adenopathy.  Skin:    General: Skin is warm and dry.     Capillary Refill: Capillary refill takes less than 2 seconds.  Neurological:     Mental Status: She is alert and oriented to person, place, and time.  Psychiatric:        Behavior: Behavior normal.      Musculoskeletal Exam: C-spine has limited ROM.   Limited mobility of lumbar spine.  Shoulder joints, elbow joints, wrist joints, MCPs, PIPs, DIPs have good range of motion with no synovitis.  Complete fist formation bilaterally.  Hip replacements have limited ROM with no groin pain.  Left knee replacement has limited extension with discomfort and warmth.  No effusion noted.  Right knee joint has good range of motion with no warmth or effusion.  Ankle joints have good range of motion with no joint tenderness.  Some pedal edema noted bilaterally.  CDAI Exam: CDAI Score: -- Patient Global: 6 mm; Provider Global: 3 mm Swollen: --; Tender: -- Joint Exam 02/20/2023   No joint exam has been documented for this visit   There is currently no information documented on the homunculus. Go to the Rheumatology activity and complete the homunculus joint exam.  Investigation: No additional findings.  Imaging: No results found.  Recent Labs: Lab Results  Component Value Date   WBC 11.9 (H) 11/22/2022   HGB 13.9 11/22/2022   PLT 200 11/22/2022   NA 141 11/22/2022   K 4.2 11/22/2022   CL 103 11/22/2022   CO2 26 11/22/2022   GLUCOSE 81 11/22/2022   BUN 6 (L) 11/22/2022   CREATININE 0.77 11/22/2022    BILITOT 0.4 11/22/2022   ALKPHOS 72 06/27/2022   AST 21 11/22/2022   ALT 13 11/22/2022   PROT 7.3 11/22/2022   ALBUMIN 4.4 06/27/2022   CALCIUM 9.4 11/22/2022   GFRAA 101 02/23/2021   QFTBGOLDPLUS NEGATIVE 03/13/2022    Speciality Comments: dxd in [place] 17 years ago.  Treated with prednisone many years, Enbrel for 6 years off and on  Procedures:  No procedures performed Allergies: Latex   Assessment / Plan:     Visit Diagnoses: Rheumatoid arthritis of multiple sites with negative rheumatoid factor (HCC) - RF negative, anti-CCP negative, positive ANA. Dxd in [place].  Treated with prednisone for many years and then Enbrel off and on for the last 6 years: She has no synovitis on examination today.  Patient continues to have intermittent arthralgias and joint stiffness typically depending on her  activity level.  She has occasional nocturnal pain in her hands but no active inflammation.  She remains on Enbrel 50 mg subcu days injections once weekly.  Patient reports that she has occasional gaps in therapy due to the concern for not receiving refills.  She is due for her Enbrel dose today.  She has been started on diclofenac 50 mg 1 tablet daily as needed for pain relief by Dr. Berline Chough.  She has not been taking diclofenac consistently but plans to start taking more frequently as needed for symptomatic relief. She will remain on Enbrel as prescribed.  She was advised to notify us if she develops signs or symptoms of a flare.  She will follow-up in the office in 3 months or sooner if needed.  High risk medication use - Enbrel 50 mg sq injections once weekly. CBC and CMP updated on 11/22/22. Orders for CBC and CMP released today.  TB gold negative on 03/13/22.Order for TB gold released today.  No recent or recurrent infections.  Discussed the importance of holding enbrel if she develops signs or symptoms of an infection and to resume once the infection has completely cleared.  - Plan: CBC with  Differential/Platelet, COMPLETE METABOLIC PANEL WITH GFR, QuantiFERON-TB Gold Plus  Screening for tuberculosis - Order for TB gold released today. Plan: QuantiFERON-TB Gold Plus  Elevated CK: No increased muscular weakness.    Primary osteoarthritis of both hands: She has intermittent discomfort in both hands typically depending on her activity level.  Last night she was experiencing nocturnal pain but woke up this morning and her hand pain has improved.  She has no synovitis on examination today.  She has established care with Dr. Berline Chough and has been started on diclofenac 50 mg 1 tablet daily as needed for pain relief.  She has not been taking diclofenac consistently but plans on taking it as needed for symptomatic relief.  H/O bilateral hip replacements: Slightly limited range of motion with no groin pain currently.  Primary osteoarthritis of right knee: Good range of motion of the right knee joint with no warmth or effusion.  History of total left knee replacement: Chronic pain.  Slightly limited extension of the left knee replacement.  Mild warmth but no effusion noted.  Using a cane to assist with ambulation.  Primary osteoarthritis of both feet: She has occasional discomfort in her feet.  She has good range of motion of both ankle joints with no tenderness.  Some pedal edema noted bilaterally.  Other medical conditions are listed as follows:  Graves' ophthalmopathy  History of COPD  H/O Guillain-Barre syndrome  Smoker: Patient has been working on tobacco cessation.    Orders: Orders Placed This Encounter  Procedures   CBC with Differential/Platelet   COMPLETE METABOLIC PANEL WITH GFR   QuantiFERON-TB Gold Plus   Meds ordered this encounter  Medications   etanercept (ENBREL SURECLICK) 50 MG/ML injection    Sig: Inject 50 mg into the skin once a week.    Dispense:  12 mL    Refill:  0     Follow-Up Instructions: Return in about 3 months (around 05/23/2023) for  Rheumatoid arthritis, Osteoarthritis.   Gearldine Bienenstock, PA-C  Note - This record has been created using Dragon software.  Chart creation errors have been sought, but may not always  have been located. Such creation errors do not reflect on  the standard of medical care.

## 2023-02-20 ENCOUNTER — Ambulatory Visit: Payer: Medicare PPO | Attending: Physician Assistant | Admitting: Physician Assistant

## 2023-02-20 ENCOUNTER — Encounter: Payer: Self-pay | Admitting: Physician Assistant

## 2023-02-20 VITALS — BP 146/103 | HR 62 | Resp 16 | Ht 67.0 in | Wt 160.0 lb

## 2023-02-20 DIAGNOSIS — R748 Abnormal levels of other serum enzymes: Secondary | ICD-10-CM

## 2023-02-20 DIAGNOSIS — F17200 Nicotine dependence, unspecified, uncomplicated: Secondary | ICD-10-CM

## 2023-02-20 DIAGNOSIS — Z8709 Personal history of other diseases of the respiratory system: Secondary | ICD-10-CM

## 2023-02-20 DIAGNOSIS — Z96643 Presence of artificial hip joint, bilateral: Secondary | ICD-10-CM

## 2023-02-20 DIAGNOSIS — Z111 Encounter for screening for respiratory tuberculosis: Secondary | ICD-10-CM

## 2023-02-20 DIAGNOSIS — Z79899 Other long term (current) drug therapy: Secondary | ICD-10-CM

## 2023-02-20 DIAGNOSIS — M19042 Primary osteoarthritis, left hand: Secondary | ICD-10-CM

## 2023-02-20 DIAGNOSIS — E0500 Thyrotoxicosis with diffuse goiter without thyrotoxic crisis or storm: Secondary | ICD-10-CM

## 2023-02-20 DIAGNOSIS — M19072 Primary osteoarthritis, left ankle and foot: Secondary | ICD-10-CM

## 2023-02-20 DIAGNOSIS — Z96652 Presence of left artificial knee joint: Secondary | ICD-10-CM

## 2023-02-20 DIAGNOSIS — Z8669 Personal history of other diseases of the nervous system and sense organs: Secondary | ICD-10-CM

## 2023-02-20 DIAGNOSIS — M0609 Rheumatoid arthritis without rheumatoid factor, multiple sites: Secondary | ICD-10-CM

## 2023-02-20 DIAGNOSIS — M19071 Primary osteoarthritis, right ankle and foot: Secondary | ICD-10-CM

## 2023-02-20 DIAGNOSIS — M1711 Unilateral primary osteoarthritis, right knee: Secondary | ICD-10-CM

## 2023-02-20 DIAGNOSIS — M19041 Primary osteoarthritis, right hand: Secondary | ICD-10-CM | POA: Diagnosis not present

## 2023-02-20 MED ORDER — ENBREL SURECLICK 50 MG/ML ~~LOC~~ SOAJ
50.0000 mg | SUBCUTANEOUS | 0 refills | Status: DC
Start: 2023-02-20 — End: 2023-07-10

## 2023-02-20 NOTE — Patient Instructions (Addendum)
Please call [employer] to set up your Enbrel refills. You are approved to receive the medication through 09/24/2023. You have to call the company/pharmacy for refills - [phone]  Standing Labs We placed an order today for your standing lab work.   Please have your standing labs drawn in September and every 3 months   Please have your labs drawn 2 weeks prior to your appointment so that the provider can discuss your lab results at your appointment, if possible.  Please note that you may see your imaging and lab results in MyChart before we have reviewed them. We will contact you once all results are reviewed. Please allow our office up to 72 hours to thoroughly review all of the results before contacting the office for clarification of your results.  WALK-IN LAB HOURS  Monday through Thursday from 8:00 am -12:30 pm and 1:00 pm-5:00 pm and Friday from 8:00 am-12:00 pm.  Patients with office visits requiring labs will be seen before walk-in labs.  You may encounter longer than normal wait times. Please allow additional time. Wait times may be shorter on  Monday and Thursday afternoons.  We do not book appointments for walk-in labs. We appreciate your patience and understanding with our staff.   Labs are drawn by [facility]. Please bring your co-pay at the time of your lab draw.  You may receive a bill from [facility] for your lab work.  Please note if you are on Hydroxychloroquine and and an order has been placed for a Hydroxychloroquine level,  you will need to have it drawn 4 hours or more after your last dose.  If you wish to have your labs drawn at another location, please call the office 24 hours in advance so we can fax the orders.  The office is located at [address], [place], [place] [address]   If you have any questions regarding directions or hours of operation,  please call [phone].   As a reminder, please drink plenty of water prior to coming for your lab work.  Thanks!

## 2023-02-21 NOTE — Progress Notes (Signed)
CBC WNL ALT is borderline elevated-33.  AST WNL. Please advise patient to limit tylenol and alcohol use.

## 2023-02-22 LAB — CBC WITH DIFFERENTIAL/PLATELET
Absolute Monocytes: 713 cells/uL (ref 200–950)
Basophils Absolute: 70 cells/uL (ref 0–200)
Basophils Relative: 0.8 %
Eosinophils Absolute: 88 cells/uL (ref 15–500)
Eosinophils Relative: 1 %
HCT: 41.8 % (ref 35.0–45.0)
Hemoglobin: 14.2 g/dL (ref 11.7–15.5)
Lymphs Abs: 3238 cells/uL (ref 850–3900)
MCH: 30.9 pg (ref 27.0–33.0)
MCHC: 34 g/dL (ref 32.0–36.0)
MCV: 90.9 fL (ref 80.0–100.0)
MPV: 10.7 fL (ref 7.5–12.5)
Monocytes Relative: 8.1 %
Neutro Abs: 4690 cells/uL (ref 1500–7800)
Neutrophils Relative %: 53.3 %
Platelets: 277 10*3/uL (ref 140–400)
RBC: 4.6 10*6/uL (ref 3.80–5.10)
RDW: 13.2 % (ref 11.0–15.0)
Total Lymphocyte: 36.8 %
WBC: 8.8 10*3/uL (ref 3.8–10.8)

## 2023-02-22 LAB — COMPLETE METABOLIC PANEL WITH GFR
AG Ratio: 1.9 (calc) (ref 1.0–2.5)
ALT: 33 U/L — ABNORMAL HIGH (ref 6–29)
AST: 31 U/L (ref 10–35)
Albumin: 4.7 g/dL (ref 3.6–5.1)
Alkaline phosphatase (APISO): 83 U/L (ref 37–153)
BUN: 10 mg/dL (ref 7–25)
CO2: 27 mmol/L (ref 20–32)
Calcium: 9.6 mg/dL (ref 8.6–10.4)
Chloride: 104 mmol/L (ref 98–110)
Creat: 0.77 mg/dL (ref 0.50–1.05)
Globulin: 2.5 g/dL (calc) (ref 1.9–3.7)
Glucose, Bld: 105 mg/dL — ABNORMAL HIGH (ref 65–99)
Potassium: 4.2 mmol/L (ref 3.5–5.3)
Sodium: 139 mmol/L (ref 135–146)
Total Bilirubin: 0.8 mg/dL (ref 0.2–1.2)
Total Protein: 7.2 g/dL (ref 6.1–8.1)
eGFR: 87 mL/min/{1.73_m2} (ref >=60)

## 2023-02-22 LAB — QUANTIFERON-TB GOLD PLUS
Mitogen-NIL: >10 IU/mL
NIL: 0.05 IU/mL
QuantiFERON-TB Gold Plus: NEGATIVE
TB1-NIL: 0.02 IU/mL
TB2-NIL: 0.01 IU/mL

## 2023-02-22 NOTE — Progress Notes (Signed)
TB gold negative

## 2023-02-25 ENCOUNTER — Encounter: Payer: Medicare PPO | Admitting: Physical Medicine and Rehabilitation

## 2023-03-27 ENCOUNTER — Encounter: Payer: Self-pay | Admitting: Physical Medicine and Rehabilitation

## 2023-03-27 ENCOUNTER — Encounter: Payer: Medicare PPO | Attending: Physical Medicine and Rehabilitation | Admitting: Physical Medicine and Rehabilitation

## 2023-03-27 VITALS — BP 143/81 | HR 81 | Ht 67.0 in | Wt 161.0 lb

## 2023-03-27 DIAGNOSIS — M7918 Myalgia, other site: Secondary | ICD-10-CM | POA: Diagnosis present

## 2023-03-27 DIAGNOSIS — M0579 Rheumatoid arthritis with rheumatoid factor of multiple sites without organ or systems involvement: Secondary | ICD-10-CM | POA: Diagnosis present

## 2023-03-27 MED ORDER — LIDOCAINE HCL 1 % IJ SOLN
3.0000 mL | Freq: Once | INTRAMUSCULAR | Status: AC
  Administered 2023-03-27: 3 mL

## 2023-03-27 MED ORDER — SERTRALINE HCL 25 MG PO TABS: 25.0000 mg | ORAL_TABLET | Freq: Every day | ORAL | 5 refills | Status: DC

## 2023-03-27 NOTE — Progress Notes (Signed)
Pt is a 62 yr old with hx of Seronegative RA and OA, B/L THR's, L TKR, Also has hx of Grave's disease- also has double vision due to Grave's disease- and GBS- 10 years ago; Also has vertigo- dizzy all the time. -   Here for f/u on chronic pain due to RA and myofascial pain.   Didn't use the tennis balls.    More of pain in neck/shoulders-  and pecs  Takes pin pill until gets to 10-11/10.   Celexa- took it- and was asleep for a week- just slept so much- so stopped it.  Took at night- took at 10pm- had hangover, backed up til 6pm- didn't make overtired- no matter when took it, had hangover- stopped because hangover.   Did try Diclofenac- feels like inflammation might be a little better- only takes when really hurting- 2-3x/week.   Feels like it brings things down a couple notches- not a "pain pill"- doesn't work as well as tylenol.   Don't take tylenol because LFTs were elevated.  Labs in May showed ALT was 33-         Plan: Patient here for trigger point injections for  Consent done and on chart.  Cleaned areas with alcohol and injected using a 27 gauge 1.5 inch needle  Injected 3cc- none wasted Using 1% Lidocaine with no EPI  Upper traps- B/L  Levators- B/L  Posterior scalenes Middle scalenes- B/L- really tight in scalenes Splenius Capitus Pectoralis Major- B?L  Rhomboids Infraspinatus Teres Major/minor Thoracic paraspinals Lumbar paraspinals Other injections-    Patient's level of pain prior was  7/10- is average amount Current level of pain after injections is imporved ROM- tension improving- 5/10.   There was no bleeding or complications.  Patient was advised to drink a lot of water on day after injections to flush system Will have increased soreness for 12-48 hours after injections.  Can use Lidocaine patches the day AFTER injections Can use theracane on day of injections in places didn't inject Can use heating pad 4-6 hours AFTER injections  2. Will  stop Celexa since too sedated/hangover.   3. Can take tylenol 1-2x/day AS NEEDED- so maybe 3x in 2 days at max- not worth it to her, because low dose not real helpful.    4. Suggest again tennis balls- the muscle hook- you can get at [employer]-  hold pressure 2-4 minutes on each spot that's bothersome- on muscles only- not bones. Theracane on [employer] if not the muscle hook- youtube, it will show you how to use it- very helpful.   5. Start Zoloft/Sertraline 25 mg daily x 2 weeks; then 50 mg daily if tolerated- for mood- to replace Celexa, that made you too sleepy. This usually wakes people up.    6. F/U in 2 months- Trp Injections and f/u on pain.    I spent a total of  32  minutes on total care today- >50% coordination of care- due to 10 minutes on injections-r est discussing issues as detailed above.

## 2023-03-27 NOTE — Patient Instructions (Signed)
Plan: Patient here for trigger point injections for  Consent done and on chart.  Cleaned areas with alcohol and injected using a 27 gauge 1.5 inch needle  Injected 3cc- none wasted Using 1% Lidocaine with no EPI  Upper traps- B/L  Levators- B/L  Posterior scalenes Middle scalenes- B/L- really tight in scalenes Splenius Capitus Pectoralis Major- B?L  Rhomboids Infraspinatus Teres Major/minor Thoracic paraspinals Lumbar paraspinals Other injections-    Patient's level of pain prior was  7/10- is average amount Current level of pain after injections is imporved ROM- tension improving- 5/10.   There was no bleeding or complications.  Patient was advised to drink a lot of water on day after injections to flush system Will have increased soreness for 12-48 hours after injections.  Can use Lidocaine patches the day AFTER injections Can use theracane on day of injections in places didn't inject Can use heating pad 4-6 hours AFTER injections  2. Will stop Celexa since too sedated/hangover.   3. Can take tylenol 1-2x/day AS NEEDED- so maybe 3x in 2 days at max- not worth it to her, because low dose not real helpful.    4. Suggest again tennis balls- the muscle hook- you can get at [employer]-  hold pressure 2-4 minutes on each spot that's bothersome- on muscles only- not bones. Theracane on [employer] if not the muscle hook- youtube, it will show you how to use it- very helpful.   5. Start Zoloft/Sertraline 25 mg daily x 2 weeks; then 50 mg daily if tolerated- for mood- to replace Celexa, that made you too sleepy. This usually wakes people up.    6. F/U in 2 months- Trp Injections and f/u on pain.

## 2023-05-28 NOTE — Progress Notes (Deleted)
Office Visit Note  Patient: Leslie Brewer             Date of Birth: 03-22-1961           MRN: [id number]             PCP: Ellyn Hack, MD Referring: Ellyn Hack, MD Visit Date: 06/06/2023 Occupation: @GUAROCC @  Subjective:  No chief complaint on file.   History of Present Illness: Leslie Brewer is a 62 y.o. female ***     Activities of Daily Living:  Patient reports morning stiffness for *** {minute/hour:19697}.   Patient {ACTIONS;DENIES/REPORTS:21021675::"Denies"} nocturnal pain.  Difficulty dressing/grooming: {ACTIONS;DENIES/REPORTS:21021675::"Denies"} Difficulty climbing stairs: {ACTIONS;DENIES/REPORTS:21021675::"Denies"} Difficulty getting out of chair: {ACTIONS;DENIES/REPORTS:21021675::"Denies"} Difficulty using hands for taps, buttons, cutlery, and/or writing: {ACTIONS;DENIES/REPORTS:21021675::"Denies"}  No Rheumatology ROS completed.   PMFS History:  Patient Active Problem List   Diagnosis Date Noted   Rheumatoid arthritis involving multiple sites with positive rheumatoid factor (HCC) 02/23/2021   H/O bilateral hip replacements 02/23/2021   History of total left knee replacement 02/23/2021    Past Medical History:  Diagnosis Date   Guillain Barr syndrome (HCC)    History of Areebah Meinders' disease    Rheumatoid arthritis (HCC)     Family History  Problem Relation Age of Onset   Cancer Mother    Healthy Daughter    Past Surgical History:  Procedure Laterality Date   APPENDECTOMY     CATARACT EXTRACTION Bilateral 2024   REPLACEMENT TOTAL KNEE Left 2020   TOTAL HIP ARTHROPLASTY Right    TOTAL HIP ARTHROPLASTY Left    TUBAL LIGATION     Social History   Social History Narrative   Not on file   Immunization History  Administered Date(s) Administered   Moderna Sars-Covid-2 Vaccination 01/01/2020, 01/29/2020, 08/29/2020, 06/13/2021     Objective: Vital Signs: There were no vitals taken for this visit.   Physical Exam   Musculoskeletal  Exam: ***  CDAI Exam: CDAI Score: -- Patient Global: --; Provider Global: -- Swollen: --; Tender: -- Joint Exam 06/06/2023   No joint exam has been documented for this visit   There is currently no information documented on the homunculus. Go to the Rheumatology activity and complete the homunculus joint exam.  Investigation: No additional findings.  Imaging: No results found.  Recent Labs: Lab Results  Component Value Date   WBC 8.8 02/20/2023   HGB 14.2 02/20/2023   PLT 277 02/20/2023   NA 139 02/20/2023   K 4.2 02/20/2023   CL 104 02/20/2023   CO2 27 02/20/2023   GLUCOSE 105 (H) 02/20/2023   BUN 10 02/20/2023   CREATININE 0.77 02/20/2023   BILITOT 0.8 02/20/2023   ALKPHOS 72 06/27/2022   AST 31 02/20/2023   ALT 33 (H) 02/20/2023   PROT 7.2 02/20/2023   ALBUMIN 4.4 06/27/2022   CALCIUM 9.6 02/20/2023   GFRAA 101 02/23/2021   QFTBGOLDPLUS NEGATIVE 02/20/2023    Speciality Comments: dxd in [place] 17 years ago.  Treated with prednisone many years, Enbrel for 6 years off and on  Procedures:  No procedures performed Allergies: Latex   Assessment / Plan:     Visit Diagnoses: No diagnosis found.  Orders: No orders of the defined types were placed in this encounter.  No orders of the defined types were placed in this encounter.   Face-to-face time spent with patient was *** minutes. Greater than 50% of time was spent in counseling and coordination of care.  Follow-Up  Instructions: No follow-ups on file.   Ellen Henri, CMA  Note - This record has been created using Animal nutritionist.  Chart creation errors have been sought, but may not always  have been located. Such creation errors do not reflect on  the standard of medical care.

## 2023-05-30 ENCOUNTER — Ambulatory Visit: Payer: Medicare PPO | Admitting: Rheumatology

## 2023-06-03 ENCOUNTER — Encounter: Payer: Medicare HMO | Admitting: Physical Medicine and Rehabilitation

## 2023-06-06 ENCOUNTER — Ambulatory Visit: Payer: Medicare PPO | Admitting: Rheumatology

## 2023-06-06 DIAGNOSIS — R748 Abnormal levels of other serum enzymes: Secondary | ICD-10-CM

## 2023-06-06 DIAGNOSIS — Z8709 Personal history of other diseases of the respiratory system: Secondary | ICD-10-CM

## 2023-06-06 DIAGNOSIS — Z79899 Other long term (current) drug therapy: Secondary | ICD-10-CM

## 2023-06-06 DIAGNOSIS — M19071 Primary osteoarthritis, right ankle and foot: Secondary | ICD-10-CM

## 2023-06-06 DIAGNOSIS — F17200 Nicotine dependence, unspecified, uncomplicated: Secondary | ICD-10-CM

## 2023-06-06 DIAGNOSIS — Z96652 Presence of left artificial knee joint: Secondary | ICD-10-CM

## 2023-06-06 DIAGNOSIS — Z96643 Presence of artificial hip joint, bilateral: Secondary | ICD-10-CM

## 2023-06-06 DIAGNOSIS — M19041 Primary osteoarthritis, right hand: Secondary | ICD-10-CM

## 2023-06-06 DIAGNOSIS — M1711 Unilateral primary osteoarthritis, right knee: Secondary | ICD-10-CM

## 2023-06-06 DIAGNOSIS — Z8669 Personal history of other diseases of the nervous system and sense organs: Secondary | ICD-10-CM

## 2023-06-06 DIAGNOSIS — M0609 Rheumatoid arthritis without rheumatoid factor, multiple sites: Secondary | ICD-10-CM

## 2023-06-06 DIAGNOSIS — E0500 Thyrotoxicosis with diffuse goiter without thyrotoxic crisis or storm: Secondary | ICD-10-CM

## 2023-06-10 ENCOUNTER — Telehealth: Payer: Self-pay | Admitting: Rheumatology

## 2023-06-10 NOTE — Telephone Encounter (Signed)
Patient called requesting to speak with [facility] regarding her renewal paperwork for her Enbrel medication.

## 2023-06-10 NOTE — Progress Notes (Deleted)
Office Visit Note  Patient: Leslie Brewer             Date of Birth: [birth year]           MRN: [id number]             PCP: Ellyn Hack, MD Referring: Ellyn Hack, MD Visit Date: 06/24/2023 Occupation: @GUAROCC @  Subjective:  No chief complaint on file.   History of Present Illness: Leslie Brewer is a 62 y.o. female ***     Activities of Daily Living:  Patient reports morning stiffness for *** {minute/hour:19697}.   Patient {ACTIONS;DENIES/REPORTS:21021675::"Denies"} nocturnal pain.  Difficulty dressing/grooming: {ACTIONS;DENIES/REPORTS:21021675::"Denies"} Difficulty climbing stairs: {ACTIONS;DENIES/REPORTS:21021675::"Denies"} Difficulty getting out of chair: {ACTIONS;DENIES/REPORTS:21021675::"Denies"} Difficulty using hands for taps, buttons, cutlery, and/or writing: {ACTIONS;DENIES/REPORTS:21021675::"Denies"}  No Rheumatology ROS completed.   PMFS History:  Patient Active Problem List   Diagnosis Date Noted  . Rheumatoid arthritis involving multiple sites with positive rheumatoid factor (HCC) 02/23/2021  . H/O bilateral hip replacements 02/23/2021  . History of total left knee replacement 02/23/2021    Past Medical History:  Diagnosis Date  . Guillain Barr syndrome (HCC)   . History of Emmanuelle Coxe' disease   . Rheumatoid arthritis (HCC)     Family History  Problem Relation Age of Onset  . Cancer Mother   . Healthy Daughter    Past Surgical History:  Procedure Laterality Date  . APPENDECTOMY    . CATARACT EXTRACTION Bilateral 2024  . REPLACEMENT TOTAL KNEE Left 2020  . TOTAL HIP ARTHROPLASTY Right   . TOTAL HIP ARTHROPLASTY Left   . TUBAL LIGATION     Social History   Social History Narrative  . Not on file   Immunization History  Administered Date(s) Administered  . Moderna Sars-Covid-2 Vaccination 01/01/2020, 01/29/2020, 08/29/2020, 06/13/2021     Objective: Vital Signs: There were no vitals taken for this visit.   Physical Exam    Musculoskeletal Exam: ***  CDAI Exam: CDAI Score: -- Patient Global: --; Provider Global: -- Swollen: --; Tender: -- Joint Exam 06/24/2023   No joint exam has been documented for this visit   There is currently no information documented on the homunculus. Go to the Rheumatology activity and complete the homunculus joint exam.  Investigation: No additional findings.  Imaging: No results found.  Recent Labs: Lab Results  Component Value Date   WBC 8.8 02/20/2023   HGB 14.2 02/20/2023   PLT 277 02/20/2023   NA 139 02/20/2023   K 4.2 02/20/2023   CL 104 02/20/2023   CO2 27 02/20/2023   GLUCOSE 105 (H) 02/20/2023   BUN 10 02/20/2023   CREATININE 0.77 02/20/2023   BILITOT 0.8 02/20/2023   ALKPHOS 72 06/27/2022   AST 31 02/20/2023   ALT 33 (H) 02/20/2023   PROT 7.2 02/20/2023   ALBUMIN 4.4 06/27/2022   CALCIUM 9.6 02/20/2023   GFRAA 101 02/23/2021   QFTBGOLDPLUS NEGATIVE 02/20/2023    Speciality Comments: dxd in [place] 17 years ago.  Treated with prednisone many years, Enbrel for 6 years off and on  Procedures:  No procedures performed Allergies: Latex   Assessment / Plan:     Visit Diagnoses: No diagnosis found.  Orders: No orders of the defined types were placed in this encounter.  No orders of the defined types were placed in this encounter.   Face-to-face time spent with patient was *** minutes. Greater than 50% of time was spent in counseling and coordination of care.  Follow-Up  Instructions: No follow-ups on file.   Ellen Henri, CMA  Note - This record has been created using Animal nutritionist.  Chart creation errors have been sought, but may not always  have been located. Such creation errors do not reflect on  the standard of medical care.

## 2023-06-11 NOTE — Telephone Encounter (Signed)
Returned call to pt. She was rqwuesting Enbrel application be ready for her at her appt. I advised that the updated application has not been release and that usually [employer] mails renewal application directly to patient homes. Advised she may email to [place] to avoid having to pay transportation to drop off to clinic. She verbalized understanding  Chesley Mires, PharmD, MPH, BCPS, CPP Clinical Pharmacist (Rheumatology and Pulmonology)

## 2023-06-24 ENCOUNTER — Ambulatory Visit: Payer: Self-pay | Admitting: Rheumatology

## 2023-06-24 DIAGNOSIS — M19041 Primary osteoarthritis, right hand: Secondary | ICD-10-CM

## 2023-06-24 DIAGNOSIS — R748 Abnormal levels of other serum enzymes: Secondary | ICD-10-CM

## 2023-06-24 DIAGNOSIS — E0500 Thyrotoxicosis with diffuse goiter without thyrotoxic crisis or storm: Secondary | ICD-10-CM

## 2023-06-24 DIAGNOSIS — M19072 Primary osteoarthritis, left ankle and foot: Secondary | ICD-10-CM

## 2023-06-24 DIAGNOSIS — M0609 Rheumatoid arthritis without rheumatoid factor, multiple sites: Secondary | ICD-10-CM

## 2023-06-24 DIAGNOSIS — M1711 Unilateral primary osteoarthritis, right knee: Secondary | ICD-10-CM

## 2023-06-24 DIAGNOSIS — Z96652 Presence of left artificial knee joint: Secondary | ICD-10-CM

## 2023-06-24 DIAGNOSIS — Z8669 Personal history of other diseases of the nervous system and sense organs: Secondary | ICD-10-CM

## 2023-06-24 DIAGNOSIS — Z8709 Personal history of other diseases of the respiratory system: Secondary | ICD-10-CM

## 2023-06-24 DIAGNOSIS — Z96643 Presence of artificial hip joint, bilateral: Secondary | ICD-10-CM

## 2023-06-24 DIAGNOSIS — F17200 Nicotine dependence, unspecified, uncomplicated: Secondary | ICD-10-CM

## 2023-06-24 DIAGNOSIS — Z79899 Other long term (current) drug therapy: Secondary | ICD-10-CM

## 2023-07-09 NOTE — Progress Notes (Unsigned)
Office Visit Note  Patient: Leslie Brewer             Date of Birth: 05-Dec-1960           MRN: [id number]             PCP: Ellyn Hack, MD Referring: Ellyn Hack, MD Visit Date: 07/10/2023 Occupation: @GUAROCC @  Subjective:  Medication monitoring  History of Present Illness: Leslie Brewer is a 62 y.o. female with history of seronegative rheumatoid arthritis and osteoarthritis. Patient is prescribed enbrel 50 mg sq injections once weekly.  Patient states for the past 1-1/2 months she has been spacing Enbrel to every other week due to the concern of running out of her prescription.  She states that while spacing her dosing of Enbrel she has had increased arthralgias and joint stiffness.  She states that the joints that normally bother her have been more severe but she denies any new concerns.  She denies any joint swelling at this time.  She continues to use a cane to assist with ambulation.  She states that she has noticed some increased arthralgias with the weather changing. She denies any recent or recurrent infections.  Activities of Daily Living:  Patient reports morning stiffness for 15 minutes.   Patient Denies nocturnal pain.  Difficulty dressing/grooming: Reports Difficulty climbing stairs: Reports Difficulty getting out of chair: Denies Difficulty using hands for taps, buttons, cutlery, and/or writing: Reports  Review of Systems  Constitutional:  Positive for fatigue.  HENT: Negative.  Negative for mouth sores, mouth dryness and nose dryness.   Eyes: Negative.  Negative for pain, visual disturbance and dryness.  Respiratory: Negative.  Negative for cough, hemoptysis, shortness of breath and difficulty breathing.   Cardiovascular: Negative.  Negative for chest pain, palpitations, hypertension and swelling in legs/feet.  Gastrointestinal:  Positive for constipation. Negative for blood in stool and diarrhea.  Endocrine: Negative.  Negative for increased urination.   Genitourinary: Negative.  Negative for painful urination and involuntary urination.  Musculoskeletal:  Positive for joint pain, joint pain, joint swelling and muscle weakness. Negative for gait problem, myalgias, morning stiffness, muscle tenderness and myalgias.  Skin:  Positive for rash. Negative for color change, pallor, hair loss, nodules/bumps, skin tightness, ulcers and sensitivity to sunlight.  Allergic/Immunologic: Negative.  Negative for susceptible to infections.  Neurological:  Positive for dizziness. Negative for numbness, headaches and weakness.  Hematological: Negative.  Negative for swollen glands.  Psychiatric/Behavioral: Negative.  Negative for depressed mood and sleep disturbance. The patient is not nervous/anxious.     PMFS History:  Patient Active Problem List   Diagnosis Date Noted   Rheumatoid arthritis involving multiple sites with positive rheumatoid factor (HCC) 02/23/2021   H/O bilateral hip replacements 02/23/2021   History of total left knee replacement 02/23/2021    Past Medical History:  Diagnosis Date   Guillain Barr syndrome (HCC)    History of Graves' disease    Rheumatoid arthritis (HCC)     Family History  Problem Relation Age of Onset   Cancer Mother    Healthy Daughter    Past Surgical History:  Procedure Laterality Date   APPENDECTOMY     CATARACT EXTRACTION Bilateral 2024   REPLACEMENT TOTAL KNEE Left 2020   TOTAL HIP ARTHROPLASTY Right    TOTAL HIP ARTHROPLASTY Left    TUBAL LIGATION     Social History   Social History Narrative   Not on file   Immunization History  Administered Date(s) Administered   Moderna Sars-Covid-2 Vaccination 01/01/2020, 01/29/2020, 08/29/2020, 06/13/2021     Objective: Vital Signs: BP (!) 153/86 (BP Location: Left Arm, Patient Position: Sitting, Cuff Size: Normal)   Pulse 71   Resp 16   Ht 5\' 7"  (1.702 m)   Wt 174 lb 3.2 oz (79 kg)   BMI 27.28 kg/m    Physical Exam Vitals and nursing note  reviewed.  Constitutional:      Appearance: She is well-developed.  HENT:     Head: Normocephalic and atraumatic.  Eyes:     Conjunctiva/sclera: Conjunctivae normal.  Cardiovascular:     Rate and Rhythm: Normal rate and regular rhythm.     Heart sounds: Normal heart sounds.  Pulmonary:     Effort: Pulmonary effort is normal.     Breath sounds: Normal breath sounds.  Abdominal:     General: Bowel sounds are normal.     Palpations: Abdomen is soft.  Musculoskeletal:     Cervical back: Normal range of motion.  Lymphadenopathy:     Cervical: No cervical adenopathy.  Skin:    General: Skin is warm and dry.     Capillary Refill: Capillary refill takes less than 2 seconds.  Neurological:     Mental Status: She is alert and oriented to person, place, and time.  Psychiatric:        Behavior: Behavior normal.      Musculoskeletal Exam: C-spine, thoracic spine, and lumbar spine good ROM.  Shoulder joints, elbow joints, wrist joints, MCPS, PIPs, and DIPs good ROM with no synovitis.  Complete fist formation bilaterally.  Bilateral hip replacements have good ROM with no groin pain. Left knee replacement has limited extension.  Right knee has good ROM with no warmth or effusion.  Ankle joints have good ROM with no tenderness or joint swelling.   CDAI Exam: CDAI Score: -- Patient Global: 80 / 100; Provider Global: 20 / 100 Swollen: --; Tender: -- Joint Exam 07/10/2023   No joint exam has been documented for this visit   There is currently no information documented on the homunculus. Go to the Rheumatology activity and complete the homunculus joint exam.  Investigation: No additional findings.  Imaging: No results found.  Recent Labs: Lab Results  Component Value Date   WBC 8.8 02/20/2023   HGB 14.2 02/20/2023   PLT 277 02/20/2023   NA 139 02/20/2023   K 4.2 02/20/2023   CL 104 02/20/2023   CO2 27 02/20/2023   GLUCOSE 105 (H) 02/20/2023   BUN 10 02/20/2023   CREATININE 0.77  02/20/2023   BILITOT 0.8 02/20/2023   ALKPHOS 72 06/27/2022   AST 31 02/20/2023   ALT 33 (H) 02/20/2023   PROT 7.2 02/20/2023   ALBUMIN 4.4 06/27/2022   CALCIUM 9.6 02/20/2023   GFRAA 101 02/23/2021   QFTBGOLDPLUS NEGATIVE 02/20/2023    Speciality Comments: dxd in [place] 17 years ago.  Treated with prednisone many years, Enbrel for 6 years off and on  Procedures:  No procedures performed Allergies: Latex   Assessment / Plan:     Visit Diagnoses: Rheumatoid arthritis of multiple sites with negative rheumatoid factor (HCC) - RF negative, anti-CCP negative, positive ANA. Dxd in [place].  Treated with prednisone for many years and then Enbrel off and on for the last 6 years: She has no synovitis on examination today.  She has been experiencing increased arthralgias and joint stiffness since she has been having to space her dosing of Enbrel to  every other week for the past 1-1/2 months.  She has been concerned about running out of her prescription for Enbrel which has led her to space the dosing.  She has also noticed increased arthralgias with cooler weather temperatures.  Patient was encouraged to remain on Enbrel 50 mg subcutaneous injections once weekly.  A refill of Enbrel was sent to the pharmacy today.  According to the patient she has been told [employer] that she has been approved for patient assistance for the upcoming year.  CBC and CMP will be updated today.  She was advised to notify us if she develops signs or symptoms of a flare.  She will follow-up in the office in 3 months or sooner if needed.  High risk medication use - Enbrel 50 mg sq injections once weekly.  CBC and CMP updated on 02/20/23. Orders for CBC and CMP released today.  Her next lab work will be due in January and every 3 months. TB gold negative on 02/20/23.   No recent or recurrent infections.  Discussed the importance of holding enbrel if she develops signs or symptoms of an infection and to resume once the infection has  completely cleared.  - Plan: CBC with Differential/Platelet, COMPLETE METABOLIC PANEL WITH GFR  Elevated CK: No increased muscular weakness.   Primary osteoarthritis of both hands: PIP and DIP thickening.  No synovitis noted.  H/O bilateral hip replacements: No increased groin pain.  Primary osteoarthritis of right knee:No warmth or effusion noted. Using a cane to assist with ambulation.   History of total left knee replacement: Chronic pain.  Using cane to assist with ambulation.   Primary osteoarthritis of both feet: She is wearing proper fitting shoes.  Other medical conditions are listed as follows:   Graves' ophthalmopathy  History of COPD  H/O Guillain-Barre syndrome  Smoker  Orders: Orders Placed This Encounter  Procedures   CBC with Differential/Platelet   COMPLETE METABOLIC PANEL WITH GFR   Meds ordered this encounter  Medications   etanercept (ENBREL SURECLICK) 50 MG/ML injection    Sig: Inject 50 mg into the skin once a week.    Dispense:  12 mL    Refill:  0     Follow-Up Instructions: Return in about 3 months (around 10/10/2023) for Rheumatoid arthritis.   Gearldine Bienenstock, PA-C  Note - This record has been created using Dragon software.  Chart creation errors have been sought, but may not always  have been located. Such creation errors do not reflect on  the standard of medical care.

## 2023-07-10 ENCOUNTER — Encounter: Payer: Self-pay | Admitting: Physician Assistant

## 2023-07-10 ENCOUNTER — Ambulatory Visit: Payer: Medicare HMO | Attending: Rheumatology | Admitting: Physician Assistant

## 2023-07-10 VITALS — BP 153/86 | HR 71 | Resp 16 | Ht 67.0 in | Wt 174.2 lb

## 2023-07-10 DIAGNOSIS — F17200 Nicotine dependence, unspecified, uncomplicated: Secondary | ICD-10-CM

## 2023-07-10 DIAGNOSIS — E0500 Thyrotoxicosis with diffuse goiter without thyrotoxic crisis or storm: Secondary | ICD-10-CM

## 2023-07-10 DIAGNOSIS — R748 Abnormal levels of other serum enzymes: Secondary | ICD-10-CM

## 2023-07-10 DIAGNOSIS — M0609 Rheumatoid arthritis without rheumatoid factor, multiple sites: Secondary | ICD-10-CM

## 2023-07-10 DIAGNOSIS — M1711 Unilateral primary osteoarthritis, right knee: Secondary | ICD-10-CM

## 2023-07-10 DIAGNOSIS — Z8669 Personal history of other diseases of the nervous system and sense organs: Secondary | ICD-10-CM

## 2023-07-10 DIAGNOSIS — M19071 Primary osteoarthritis, right ankle and foot: Secondary | ICD-10-CM

## 2023-07-10 DIAGNOSIS — Z96643 Presence of artificial hip joint, bilateral: Secondary | ICD-10-CM

## 2023-07-10 DIAGNOSIS — M19042 Primary osteoarthritis, left hand: Secondary | ICD-10-CM

## 2023-07-10 DIAGNOSIS — Z96652 Presence of left artificial knee joint: Secondary | ICD-10-CM

## 2023-07-10 DIAGNOSIS — M19041 Primary osteoarthritis, right hand: Secondary | ICD-10-CM

## 2023-07-10 DIAGNOSIS — M19072 Primary osteoarthritis, left ankle and foot: Secondary | ICD-10-CM

## 2023-07-10 DIAGNOSIS — Z79899 Other long term (current) drug therapy: Secondary | ICD-10-CM

## 2023-07-10 DIAGNOSIS — Z8709 Personal history of other diseases of the respiratory system: Secondary | ICD-10-CM

## 2023-07-10 MED ORDER — ENBREL SURECLICK 50 MG/ML ~~LOC~~ SOAJ: 50.0000 mg | SUBCUTANEOUS | 0 refills | Status: DC

## 2023-07-10 NOTE — Patient Instructions (Signed)

## 2023-07-11 ENCOUNTER — Telehealth: Payer: Self-pay | Admitting: Pharmacist

## 2023-07-11 ENCOUNTER — Encounter: Payer: Self-pay | Admitting: Pharmacist

## 2023-07-11 ENCOUNTER — Other Ambulatory Visit (HOSPITAL_COMMUNITY): Payer: Self-pay

## 2023-07-11 LAB — CBC WITH DIFFERENTIAL/PLATELET
Absolute Lymphocytes: 3744 {cells}/uL (ref 850–3900)
Absolute Monocytes: 936 {cells}/uL (ref 200–950)
Basophils Absolute: 83 {cells}/uL (ref 0–200)
Basophils Relative: 0.8 %
Eosinophils Absolute: 94 {cells}/uL (ref 15–500)
Eosinophils Relative: 0.9 %
HCT: 43.1 % (ref 35.0–45.0)
Hemoglobin: 13.8 g/dL (ref 11.7–15.5)
MCH: 29.7 pg (ref 27.0–33.0)
MCHC: 32 g/dL (ref 32.0–36.0)
MCV: 92.7 fL (ref 80.0–100.0)
MPV: 10.9 fL (ref 7.5–12.5)
Monocytes Relative: 9 %
Neutro Abs: 5543 {cells}/uL (ref 1500–7800)
Neutrophils Relative %: 53.3 %
Platelets: 295 10*3/uL (ref 140–400)
RBC: 4.65 10*6/uL (ref 3.80–5.10)
RDW: 13.3 % (ref 11.0–15.0)
Total Lymphocyte: 36 %
WBC: 10.4 10*3/uL (ref 3.8–10.8)

## 2023-07-11 LAB — COMPLETE METABOLIC PANEL WITH GFR
AG Ratio: 1.8 (calc) (ref 1.0–2.5)
ALT: 18 U/L (ref 6–29)
AST: 19 U/L (ref 10–35)
Albumin: 4.6 g/dL (ref 3.6–5.1)
Alkaline phosphatase (APISO): 89 U/L (ref 37–153)
BUN: 8 mg/dL (ref 7–25)
CO2: 26 mmol/L (ref 20–32)
Calcium: 10.1 mg/dL (ref 8.6–10.4)
Chloride: 104 mmol/L (ref 98–110)
Creat: 0.97 mg/dL (ref 0.50–1.05)
Globulin: 2.6 g/dL (ref 1.9–3.7)
Glucose, Bld: 89 mg/dL (ref 65–99)
Potassium: 4.4 mmol/L (ref 3.5–5.3)
Sodium: 140 mmol/L (ref 135–146)
Total Bilirubin: 0.5 mg/dL (ref 0.2–1.2)
Total Protein: 7.2 g/dL (ref 6.1–8.1)
eGFR: 66 mL/min/{1.73_m2} (ref >=60)

## 2023-07-11 NOTE — Progress Notes (Signed)
CBC and CMP WNL

## 2023-07-11 NOTE — Telephone Encounter (Signed)
Patient completed PAP renewal for Enbrel at [facility] yesterday. Called patient and collected income information per pt request yest. Provider signature obtained from Sherron Ales, PA-C  Submitted a Prior Authorization request to [facility] for ENBREL via CoverMyMeds. Will update once we receive a response.  Key: Lenna Sciara, PharmD, MPH, BCPS, CPP Clinical Pharmacist (Rheumatology and Pulmonology)

## 2023-07-11 NOTE — Telephone Encounter (Signed)
Error

## 2023-07-11 NOTE — Telephone Encounter (Signed)
Received notification from [facility] regarding a prior authorization for ENBREL. Authorization has been APPROVED from 07/11/23 to 09/23/24. Approval letter sent to scan center.  Per test claim, copay for 28 days supply is $2,183.07 $77.97/day  Submitted Patient Assistance RENEWAL Application to [employer] for ENBREL along with provider portion, patient portion, PA, medication list, insurance card copy. Will update patient when we receive a response.  Phone #: [phone] Fax #: [phone]  Authorization # [id number] Phone # [phone]

## 2023-07-15 NOTE — Telephone Encounter (Signed)
Received a fax from  [employer] regarding an approval for ENBREL patient assistance from 09/25/2023 to 09/23/2024. Approval letter sent to scan center.  Phone #: [phone] Case ID: [id number] Fax #: [phone]  ATC pt to notify. Phone went straight to VM> Left VM advising of approval.  Chesley Mires, PharmD, MPH, BCPS, CPP Clinical Pharmacist (Rheumatology and Pulmonology)

## 2023-09-26 NOTE — Progress Notes (Deleted)
Office Visit Note  Patient: Leslie Brewer             Date of Birth: 1961/02/11           MRN: [id number]             PCP: Ellyn Hack, MD Referring: Ellyn Hack, MD Visit Date: 10/10/2023 Occupation: @GUAROCC @  Subjective:  No chief complaint on file.   History of Present Illness: Leslie Brewer is a 63 y.o. female ***     Activities of Daily Living:  Patient reports morning stiffness for *** {minute/hour:19697}.   Patient {ACTIONS;DENIES/REPORTS:21021675::"Denies"} nocturnal pain.  Difficulty dressing/grooming: {ACTIONS;DENIES/REPORTS:21021675::"Denies"} Difficulty climbing stairs: {ACTIONS;DENIES/REPORTS:21021675::"Denies"} Difficulty getting out of chair: {ACTIONS;DENIES/REPORTS:21021675::"Denies"} Difficulty using hands for taps, buttons, cutlery, and/or writing: {ACTIONS;DENIES/REPORTS:21021675::"Denies"}  No Rheumatology ROS completed.   PMFS History:  Patient Active Problem List   Diagnosis Date Noted   Rheumatoid arthritis involving multiple sites with positive rheumatoid factor (HCC) 02/23/2021   H/O bilateral hip replacements 02/23/2021   History of total left knee replacement 02/23/2021    Past Medical History:  Diagnosis Date   Guillain Barr syndrome (HCC)    History of Graves' disease    Rheumatoid arthritis (HCC)     Family History  Problem Relation Age of Onset   Cancer Mother    Healthy Daughter    Past Surgical History:  Procedure Laterality Date   APPENDECTOMY     CATARACT EXTRACTION Bilateral 2024   REPLACEMENT TOTAL KNEE Left 2020   TOTAL HIP ARTHROPLASTY Right    TOTAL HIP ARTHROPLASTY Left    TUBAL LIGATION     Social History   Social History Narrative   Not on file   Immunization History  Administered Date(s) Administered   Moderna Sars-Covid-2 Vaccination 01/01/2020, 01/29/2020, 08/29/2020, 06/13/2021     Objective: Vital Signs: There were no vitals taken for this visit.   Physical Exam   Musculoskeletal  Exam: ***  CDAI Exam: CDAI Score: -- Patient Global: --; Provider Global: -- Swollen: --; Tender: -- Joint Exam 10/10/2023   No joint exam has been documented for this visit   There is currently no information documented on the homunculus. Go to the Rheumatology activity and complete the homunculus joint exam.  Investigation: No additional findings.  Imaging: No results found.  Recent Labs: Lab Results  Component Value Date   WBC 10.4 07/10/2023   HGB 13.8 07/10/2023   PLT 295 07/10/2023   NA 140 07/10/2023   K 4.4 07/10/2023   CL 104 07/10/2023   CO2 26 07/10/2023   GLUCOSE 89 07/10/2023   BUN 8 07/10/2023   CREATININE 0.97 07/10/2023   BILITOT 0.5 07/10/2023   ALKPHOS 72 06/27/2022   AST 19 07/10/2023   ALT 18 07/10/2023   PROT 7.2 07/10/2023   ALBUMIN 4.4 06/27/2022   CALCIUM 10.1 07/10/2023   GFRAA 101 02/23/2021   QFTBGOLDPLUS NEGATIVE 02/20/2023    Speciality Comments: dxd in [place] 17 years ago.  Treated with prednisone many years, Enbrel for 6 years off and on  Procedures:  No procedures performed Allergies: Latex   Assessment / Plan:     Visit Diagnoses: Rheumatoid arthritis of multiple sites with negative rheumatoid factor (HCC)  High risk medication use  Elevated CK  Primary osteoarthritis of both hands  H/O bilateral hip replacements  Primary osteoarthritis of right knee  History of total left knee replacement  Primary osteoarthritis of both feet  Graves' ophthalmopathy  History of COPD  H/O Guillain-Barre syndrome  Smoker  Orders: No orders of the defined types were placed in this encounter.  No orders of the defined types were placed in this encounter.   Face-to-face time spent with patient was *** minutes. Greater than 50% of time was spent in counseling and coordination of care.  Follow-Up Instructions: No follow-ups on file.   Gearldine Bienenstock, PA-C  Note - This record has been created using Dragon software.  Chart  creation errors have been sought, but may not always  have been located. Such creation errors do not reflect on  the standard of medical care.

## 2023-10-02 NOTE — Progress Notes (Signed)
 Office Visit Note  Patient: Leslie Brewer             Date of Birth: 11-09-1960           MRN: [id number]             PCP: Maree Leni Edyth DELENA, MD Referring: Maree Leni Edyth DELENA, MD Visit Date: 10/07/2023 Occupation: @GUAROCC @  Subjective:  Pain in both wrists   History of Present Illness: Leslie Brewer is a 63 y.o. female with history of rheumatoid arthritis.   She remains on Enbrel  50 mg sq injections once weekly.  She is tolerating Enbrel  without any side effects or injection site reactions.  She has not had any recent gaps in therapy.  Patient continues to have chronic pain in both hands but denies any joint swelling.  She has had some increased discomfort involving both wrist joints as well as her elbows.  She continues to use a cane to assist with ambulation.  She is intermittent discomfort in the left knee replacement. She denies any recent or recurrent infections.   Activities of Daily Living:  Patient reports morning stiffness for 15-30 minutes.   Patient Reports nocturnal pain.  Difficulty dressing/grooming: Reports Difficulty climbing stairs: Reports Difficulty getting out of chair: Reports Difficulty using hands for taps, buttons, cutlery, and/or writing: Reports  Review of Systems  Constitutional:  Positive for fatigue.  HENT:  Negative for mouth sores and mouth dryness.   Eyes:  Positive for dryness.  Respiratory:  Positive for shortness of breath.   Cardiovascular:  Negative for chest pain and palpitations.  Gastrointestinal:  Positive for constipation. Negative for blood in stool and diarrhea.  Endocrine: Positive for increased urination.  Genitourinary:  Negative for involuntary urination.  Musculoskeletal:  Positive for joint pain, gait problem, joint pain, myalgias, muscle weakness, morning stiffness and myalgias. Negative for joint swelling and muscle tenderness.  Skin:  Positive for sensitivity to sunlight. Negative for color change, rash and hair loss.   Allergic/Immunologic: Negative for susceptible to infections.  Neurological:  Positive for dizziness and headaches.  Hematological:  Negative for swollen glands.  Psychiatric/Behavioral:  Positive for depressed mood and sleep disturbance. The patient is nervous/anxious.     PMFS History:  Patient Active Problem List   Diagnosis Date Noted   Rheumatoid arthritis involving multiple sites with positive rheumatoid factor (HCC) 02/23/2021   H/O bilateral hip replacements 02/23/2021   History of total left knee replacement 02/23/2021    Past Medical History:  Diagnosis Date   Guillain Barr syndrome (HCC)    History of Graves' disease    Rheumatoid arthritis (HCC)     Family History  Problem Relation Age of Onset   Cancer Mother    Healthy Daughter    Past Surgical History:  Procedure Laterality Date   APPENDECTOMY     CATARACT EXTRACTION Bilateral 2024   REPLACEMENT TOTAL KNEE Left 2020   TOTAL HIP ARTHROPLASTY Right    TOTAL HIP ARTHROPLASTY Left    TUBAL LIGATION     Social History   Social History Narrative   Not on file   Immunization History  Administered Date(s) Administered   Moderna Sars-Covid-2 Vaccination 01/01/2020, 01/29/2020, 08/29/2020, 06/13/2021     Objective: Vital Signs: BP 123/73 (BP Location: Left Arm, Patient Position: Sitting, Cuff Size: Normal)   Pulse 76   Resp 14   Ht 5' 7 (1.702 m)   Wt 173 lb (78.5 kg)   BMI 27.10 kg/m  Physical Exam Vitals and nursing note reviewed.  Constitutional:      Appearance: She is well-developed.  HENT:     Head: Normocephalic and atraumatic.  Eyes:     Conjunctiva/sclera: Conjunctivae normal.  Cardiovascular:     Rate and Rhythm: Normal rate and regular rhythm.     Heart sounds: Normal heart sounds.  Pulmonary:     Effort: Pulmonary effort is normal.     Breath sounds: Normal breath sounds.  Abdominal:     General: Bowel sounds are normal.     Palpations: Abdomen is soft.  Musculoskeletal:      Cervical back: Normal range of motion.  Lymphadenopathy:     Cervical: No cervical adenopathy.  Skin:    General: Skin is warm and dry.     Capillary Refill: Capillary refill takes less than 2 seconds.  Neurological:     Mental Status: She is alert and oriented to person, place, and time.  Psychiatric:        Behavior: Behavior normal.      Musculoskeletal Exam: C-spine, thoracic spine, and lumbar spine have good range of motion.  Shoulder joints, elbow joints, wrist joints, MCPs, PIPs, DIPs have good range of motion with no synovitis.  Complete fist formation bilaterally.  Bilateral hip replacements have good range of motion.  Left knee replacement has slightly limited extension.  Right knee has good range of motion no warmth or effusion.  Ankle joints have good range of motion with no tenderness or joint swelling.  CDAI Exam: CDAI Score: -- Patient Global: --; Provider Global: -- Swollen: --; Tender: -- Joint Exam 10/07/2023   No joint exam has been documented for this visit   There is currently no information documented on the homunculus. Go to the Rheumatology activity and complete the homunculus joint exam.  Investigation: No additional findings.  Imaging: No results found.  Recent Labs: Lab Results  Component Value Date   WBC 10.4 07/10/2023   HGB 13.8 07/10/2023   PLT 295 07/10/2023   NA 140 07/10/2023   K 4.4 07/10/2023   CL 104 07/10/2023   CO2 26 07/10/2023   GLUCOSE 89 07/10/2023   BUN 8 07/10/2023   CREATININE 0.97 07/10/2023   BILITOT 0.5 07/10/2023   ALKPHOS 72 06/27/2022   AST 19 07/10/2023   ALT 18 07/10/2023   PROT 7.2 07/10/2023   ALBUMIN 4.4 06/27/2022   CALCIUM 10.1 07/10/2023   GFRAA 101 02/23/2021   QFTBGOLDPLUS NEGATIVE 02/20/2023    Speciality Comments: dxd in [place] 17 years ago.  Treated with prednisone many years, Enbrel  for 6 years off and on  Procedures:  No procedures performed Allergies: Latex   Assessment / Plan:     Visit  Diagnoses: Rheumatoid arthritis of multiple sites with negative rheumatoid factor (HCC) - RF negative, anti-CCP negative, positive ANA. Dxd in [place].  Treated with prednisone for many years and then Enbrel  off and on for the last 6 years: She has no synovitis on examination today.  She has clinically been doing well on Enbrel  50 mg sq injections once weekly.  She is tolerating Enbrel  without any side effects or injection site reactions.  She has not had any recent gaps in therapy.  She continues to have chronic pain involving both hands but has also had some increased discomfort in her wrists and elbows.  On examination she has tenderness over the lateral epicondyle of both elbows consistent with tendinitis.  Overall her rheumatoid arthritis remains well-controlled on Enbrel   as monotherapy.  She was advised to notify us  if she develops signs or symptoms of a flare.  She will follow-up in the office in 5 months or sooner if needed.  High risk medication use - Enbrel  50 mg sq injections once weekly.  CBC and CMP updated on 07/10/23-WNL.  Orders for CBC and CMP released today.   TB gold negative on 02/20/23.   No recent or recurrent infections.  Discussed the importance of holding enbrel  if she develops signs or symptoms of an infection and to resume once the infection has completely cleared.  - Plan: COMPLETE METABOLIC PANEL WITH GFR, CBC with Differential/Platelet  Elevated CK: Repeat CK was within normal limits: 141 on 04/13/2021.  No muscular weakness.   Primary osteoarthritis of both hands: Chronic pain.  No synovitis noted.  Discussed the importance of joint protection and muscle strengthening.  Also discussed the use of arthritis compression gloves.  H/O bilateral hip replacements: No increased groin pain at this time.  Primary osteoarthritis of right knee: No warmth or effusion noted today.  History of total left knee replacement: Intermittent discomfort.  Using a cane to assist with  ambulation.  Primary osteoarthritis of both feet: She is not experiencing any increased discomfort in her feet at this time.  No tenderness or swelling of the ankle joints noted.  Other medical conditions are listed as follows:  Graves' ophthalmopathy  History of COPD  H/O Guillain-Barre syndrome  Smoker  Orders: Orders Placed This Encounter  Procedures   COMPLETE METABOLIC PANEL WITH GFR   CBC with Differential/Platelet   No orders of the defined types were placed in this encounter.    Follow-Up Instructions: Return in about 3 months (around 01/05/2024) for Rheumatoid arthritis.   Waddell CHRISTELLA Craze, PA-C  Note - This record has been created using Dragon software.  Chart creation errors have been sought, but may not always  have been located. Such creation errors do not reflect on  the standard of medical care.

## 2023-10-07 ENCOUNTER — Encounter: Payer: Self-pay | Admitting: Physician Assistant

## 2023-10-07 ENCOUNTER — Ambulatory Visit: Payer: Medicare HMO | Attending: Physician Assistant | Admitting: Physician Assistant

## 2023-10-07 VITALS — BP 123/73 | HR 76 | Resp 14 | Ht 67.0 in | Wt 173.0 lb

## 2023-10-07 DIAGNOSIS — F17200 Nicotine dependence, unspecified, uncomplicated: Secondary | ICD-10-CM

## 2023-10-07 DIAGNOSIS — Z8669 Personal history of other diseases of the nervous system and sense organs: Secondary | ICD-10-CM

## 2023-10-07 DIAGNOSIS — Z79899 Other long term (current) drug therapy: Secondary | ICD-10-CM

## 2023-10-07 DIAGNOSIS — M19041 Primary osteoarthritis, right hand: Secondary | ICD-10-CM | POA: Diagnosis not present

## 2023-10-07 DIAGNOSIS — Z8709 Personal history of other diseases of the respiratory system: Secondary | ICD-10-CM

## 2023-10-07 DIAGNOSIS — M1711 Unilateral primary osteoarthritis, right knee: Secondary | ICD-10-CM

## 2023-10-07 DIAGNOSIS — E0500 Thyrotoxicosis with diffuse goiter without thyrotoxic crisis or storm: Secondary | ICD-10-CM

## 2023-10-07 DIAGNOSIS — M19042 Primary osteoarthritis, left hand: Secondary | ICD-10-CM

## 2023-10-07 DIAGNOSIS — M19072 Primary osteoarthritis, left ankle and foot: Secondary | ICD-10-CM

## 2023-10-07 DIAGNOSIS — M19071 Primary osteoarthritis, right ankle and foot: Secondary | ICD-10-CM

## 2023-10-07 DIAGNOSIS — R748 Abnormal levels of other serum enzymes: Secondary | ICD-10-CM

## 2023-10-07 DIAGNOSIS — Z96643 Presence of artificial hip joint, bilateral: Secondary | ICD-10-CM

## 2023-10-07 DIAGNOSIS — Z96652 Presence of left artificial knee joint: Secondary | ICD-10-CM

## 2023-10-07 DIAGNOSIS — M0609 Rheumatoid arthritis without rheumatoid factor, multiple sites: Secondary | ICD-10-CM | POA: Diagnosis not present

## 2023-10-07 NOTE — Patient Instructions (Signed)
 Standing Labs We placed an order today for your standing lab work.   Please have your standing labs drawn in April and every 3 months   Please have your labs drawn 2 weeks prior to your appointment so that the provider can discuss your lab results at your appointment, if possible.  Please note that you may see your imaging and lab results in MyChart before we have reviewed them. We will contact you once all results are reviewed. Please allow our office up to 72 hours to thoroughly review all of the results before contacting the office for clarification of your results.  WALK-IN LAB HOURS  Monday through Thursday from 8:00 am -12:30 pm and 1:00 pm-5:00 pm and Friday from 8:00 am-12:00 pm.  Patients with office visits requiring labs will be seen before walk-in labs.  You may encounter longer than normal wait times. Please allow additional time. Wait times may be shorter on  Monday and Thursday afternoons.  We do not book appointments for walk-in labs. We appreciate your patience and understanding with our staff.   Labs are drawn by [facility]. Please bring your co-pay at the time of your lab draw.  You may receive a bill from [facility] for your lab work.  Please note if you are on Hydroxychloroquine and and an order has been placed for a Hydroxychloroquine level,  you will need to have it drawn 4 hours or more after your last dose.  If you wish to have your labs drawn at another location, please call the office 24 hours in advance so we can fax the orders.  The office is located at [address], [place], [place] [address]   If you have any questions regarding directions or hours of operation,  please call [phone].   As a reminder, please drink plenty of water prior to coming for your lab work. Thanks!

## 2023-10-08 LAB — CBC WITH DIFFERENTIAL/PLATELET
Absolute Lymphocytes: 4404 {cells}/uL — ABNORMAL HIGH (ref 850–3900)
Absolute Monocytes: 949 {cells}/uL (ref 200–950)
Basophils Absolute: 81 {cells}/uL (ref 0–200)
Basophils Relative: 0.8 %
Eosinophils Absolute: 71 {cells}/uL (ref 15–500)
Eosinophils Relative: 0.7 %
HCT: 41.6 % (ref 35.0–45.0)
Hemoglobin: 13.3 g/dL (ref 11.7–15.5)
MCH: 29 pg (ref 27.0–33.0)
MCHC: 32 g/dL (ref 32.0–36.0)
MCV: 90.8 fL (ref 80.0–100.0)
MPV: 10.8 fL (ref 7.5–12.5)
Monocytes Relative: 9.4 %
Neutro Abs: 4596 {cells}/uL (ref 1500–7800)
Neutrophils Relative %: 45.5 %
Platelets: 318 10*3/uL (ref 140–400)
RBC: 4.58 10*6/uL (ref 3.80–5.10)
RDW: 13.7 % (ref 11.0–15.0)
Total Lymphocyte: 43.6 %
WBC: 10.1 10*3/uL (ref 3.8–10.8)

## 2023-10-08 LAB — COMPLETE METABOLIC PANEL WITH GFR
AG Ratio: 1.7 (calc) (ref 1.0–2.5)
ALT: 19 U/L (ref 6–29)
AST: 22 U/L (ref 10–35)
Albumin: 4.6 g/dL (ref 3.6–5.1)
Alkaline phosphatase (APISO): 84 U/L (ref 37–153)
BUN: 7 mg/dL (ref 7–25)
CO2: 27 mmol/L (ref 20–32)
Calcium: 10.3 mg/dL (ref 8.6–10.4)
Chloride: 106 mmol/L (ref 98–110)
Creat: 0.8 mg/dL (ref 0.50–1.05)
Globulin: 2.7 g/dL (ref 1.9–3.7)
Glucose, Bld: 89 mg/dL (ref 65–99)
Potassium: 4.5 mmol/L (ref 3.5–5.3)
Sodium: 142 mmol/L (ref 135–146)
Total Bilirubin: 0.4 mg/dL (ref 0.2–1.2)
Total Protein: 7.3 g/dL (ref 6.1–8.1)
eGFR: 83 mL/min/{1.73_m2} (ref >=60)

## 2023-10-08 NOTE — Progress Notes (Signed)
 Absolute lymphocytes are slightly elevated. Rest of CBC WNL.  We will continue to monitor.   CMP WNL.

## 2023-10-10 ENCOUNTER — Ambulatory Visit: Payer: Medicare HMO | Admitting: Physician Assistant

## 2023-10-10 DIAGNOSIS — Z8669 Personal history of other diseases of the nervous system and sense organs: Secondary | ICD-10-CM

## 2023-10-10 DIAGNOSIS — M0609 Rheumatoid arthritis without rheumatoid factor, multiple sites: Secondary | ICD-10-CM

## 2023-10-10 DIAGNOSIS — Z96643 Presence of artificial hip joint, bilateral: Secondary | ICD-10-CM

## 2023-10-10 DIAGNOSIS — F17200 Nicotine dependence, unspecified, uncomplicated: Secondary | ICD-10-CM

## 2023-10-10 DIAGNOSIS — M19041 Primary osteoarthritis, right hand: Secondary | ICD-10-CM

## 2023-10-10 DIAGNOSIS — M19071 Primary osteoarthritis, right ankle and foot: Secondary | ICD-10-CM

## 2023-10-10 DIAGNOSIS — Z8709 Personal history of other diseases of the respiratory system: Secondary | ICD-10-CM

## 2023-10-10 DIAGNOSIS — E0500 Thyrotoxicosis with diffuse goiter without thyrotoxic crisis or storm: Secondary | ICD-10-CM

## 2023-10-10 DIAGNOSIS — M1711 Unilateral primary osteoarthritis, right knee: Secondary | ICD-10-CM

## 2023-10-10 DIAGNOSIS — Z79899 Other long term (current) drug therapy: Secondary | ICD-10-CM

## 2023-10-10 DIAGNOSIS — R748 Abnormal levels of other serum enzymes: Secondary | ICD-10-CM

## 2023-10-10 DIAGNOSIS — Z96652 Presence of left artificial knee joint: Secondary | ICD-10-CM

## 2023-12-25 NOTE — Progress Notes (Signed)
 Office Visit Note  Patient: Leslie Brewer             Date of Birth: Jun 21, 1961           MRN: [id number]             PCP: Ulysees Gander, MD Referring: Ulysees Gander, MD Visit Date: 01/08/2024 Occupation: @GUAROCC @  Subjective:  Pain in multiple joints  History of Present Illness: Leslie Brewer is a 63 y.o. female with seronegative rheumatoid arthritis.  She returns today after last visit in January 2025.  She continues to have some discomfort in her elbows, wrist and her hands.  She believes her hand pain has been getting worse.  She has difficulty buttoning and zipping.  She has difficulty climbing stairs and also getting up from the chair.  Her left knee joint is replaced.  She ambulates with the help of a cane.  She has been on Enbrel 50 mg subcu weekly.  She denies any interruption in the treatment.    Activities of Daily Living:  Patient reports morning stiffness for 10 minutes.   Patient Reports nocturnal pain.  Difficulty dressing/grooming: Reports Difficulty climbing stairs: Reports Difficulty getting out of chair: Reports Difficulty using hands for taps, buttons, cutlery, and/or writing: Reports  Review of Systems  Constitutional:  Positive for fatigue.  HENT: Negative.  Negative for mouth sores and mouth dryness.   Eyes:  Positive for dryness.  Respiratory:  Positive for shortness of breath.   Cardiovascular: Negative.  Negative for chest pain and palpitations.  Gastrointestinal: Negative.  Negative for blood in stool, constipation and diarrhea.  Endocrine: Positive for increased urination.  Genitourinary: Negative.  Negative for involuntary urination.  Musculoskeletal:  Positive for joint pain, gait problem, joint pain, joint swelling, muscle weakness, morning stiffness and muscle tenderness. Negative for myalgias and myalgias.  Skin:  Positive for rash. Negative for color change, hair loss and sensitivity to sunlight.  Allergic/Immunologic: Negative.   Negative for susceptible to infections.  Neurological:  Positive for dizziness. Negative for headaches.  Hematological: Negative.  Negative for swollen glands.  Psychiatric/Behavioral:  Positive for sleep disturbance. Negative for depressed mood. The patient is not nervous/anxious.     PMFS History:  Patient Active Problem List   Diagnosis Date Noted   Rheumatoid arthritis involving multiple sites with positive rheumatoid factor (HCC) 02/23/2021   H/O bilateral hip replacements 02/23/2021   History of total left knee replacement 02/23/2021    Past Medical History:  Diagnosis Date   Guillain Barr syndrome (HCC)    History of Graves' disease    Rheumatoid arthritis (HCC)     Family History  Problem Relation Age of Onset   Cancer Mother    Healthy Daughter    Past Surgical History:  Procedure Laterality Date   APPENDECTOMY     CATARACT EXTRACTION Bilateral 2024   REPLACEMENT TOTAL KNEE Left 2020   TOTAL HIP ARTHROPLASTY Right    TOTAL HIP ARTHROPLASTY Left    TUBAL LIGATION     Social History   Social History Narrative   Not on file   Immunization History  Administered Date(s) Administered   Moderna Sars-Covid-2 Vaccination 01/01/2020, 01/29/2020, 08/29/2020, 06/13/2021     Objective: Vital Signs: BP 109/73 (BP Location: Right Arm, Patient Position: Sitting, Cuff Size: Normal)   Pulse 80   Resp 16   Ht 5\' 7"  (1.702 m)   Wt 162 lb 4.8 oz (73.6 kg)   BMI 25.42  kg/m    Physical Exam Vitals and nursing note reviewed.  Constitutional:      Appearance: She is well-developed.  HENT:     Head: Normocephalic and atraumatic.  Eyes:     Conjunctiva/sclera: Conjunctivae normal.  Cardiovascular:     Rate and Rhythm: Normal rate and regular rhythm.     Heart sounds: Normal heart sounds.  Pulmonary:     Effort: Pulmonary effort is normal.     Breath sounds: Normal breath sounds.  Abdominal:     General: Bowel sounds are normal.     Palpations: Abdomen is soft.   Musculoskeletal:     Cervical back: Normal range of motion.  Lymphadenopathy:     Cervical: No cervical adenopathy.  Skin:    General: Skin is warm and dry.     Capillary Refill: Capillary refill takes less than 2 seconds.  Neurological:     Mental Status: She is alert and oriented to person, place, and time.  Psychiatric:        Behavior: Behavior normal.      Musculoskeletal Exam: Cervical, thoracic and lumbar spine with good range of motion without tenderness.  Shoulders, elbows, wrists, MCPs and PIPs were in good range of motion with no synovitis.  She had bilateral PIP and DIP thickening.  Hip joints were replaced and had some limitation with range of motion without discomfort.  Left knee joint was replaced with limited extension.  Right knee joint had limited extension without any warmth swelling or effusion.  There was no tenderness over ankles or MTPs.  CDAI Exam: CDAI Score: -- Patient Global: --; Provider Global: -- Swollen: --; Tender: -- Joint Exam 01/08/2024   No joint exam has been documented for this visit   There is currently no information documented on the homunculus. Go to the Rheumatology activity and complete the homunculus joint exam.  Investigation: No additional findings.  Imaging: No results found.  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  Treated with prednisone many years, Enbrel for 6 years off and on  Procedures:  No procedures  performed Allergies: Latex   Assessment / Plan:     Visit Diagnoses: Rheumatoid arthritis of multiple sites with negative rheumatoid factor (HCC) - RF negative, anti-CCP negative, positive ANA.  Intermittent treatment with Enbrel since 2019. -Patient denies any interruption in the treatment with Enbrel.  She continues to have pain and discomfort in multiple joints.  No synovitis was noted on the examination.  Prescription refill for Enbrel was sent today.  Plan: etanercept (ENBREL SURECLICK) 50 MG/ML injection  High risk medication use - Enbrel 50 mg subcu weekly.  TB Gold - May 29/2024 -October 07, 2023 CBC and CMP were normal.  Will recheck labs today and also will repeat TB Gold.  Plan: CBC with Differential/Platelet, Comprehensive metabolic panel with GFR, QuantiFERON-TB Gold Plus.  Patient was advised  to get labs every 3 months.  Information on immunization was placed in the AVS.  She was advised to hold Enbrel if she develops an infection resume after the infection resolves.  Increased risk of skin cancer with Enbrel was discussed.  Use of sunscreen and sun protection was discussed.  Primary osteoarthritis of both hands-she has bilateral PIP and DIP thickening.  She reports decreased grip strength and ongoing discomfort in her hands.  No active synovitis was noted.  Joint protection muscle strengthening was discussed.  A handout on hand exercises was placed in the AVS.  H/O bilateral hip replacements-she had limited range of motion of bilateral hip joints without discomfort.  Primary osteoarthritis of right knee-she had limited extension of her right knee joint.  She ambulates with the help of a cane.  No warmth swelling or effusion was noted.  A handout on lower extremity exercise was placed in the AVS.  History of total left knee replacement-she had limited extension of her left knee joint which was replaced.  No warmth swelling or effusion was noted.  She has chronic discomfort.  Primary  osteoarthritis of both feet-no synovitis was noted on the examination.  Dyslipidemia -I will check lipid panel today.  Plan: Lipid panel  Graves' ophthalmopathy  H/O Guillain-Barre syndrome  History of COPD  Smoker-smoking cessation was discussed.  Association of smoking with rheumatoid arthritis was discussed.  Orders: Orders Placed This Encounter  Procedures   CBC with Differential/Platelet   Comprehensive metabolic panel with GFR   Lipid panel   QuantiFERON-TB Gold Plus   Meds ordered this encounter  Medications   etanercept (ENBREL SURECLICK) 50 MG/ML injection    Sig: Inject 50 mg into the skin once a week.    Dispense:  12 mL    Refill:  0     Follow-Up Instructions: Return in about 3 months (around 04/08/2024) for Rheumatoid arthritis.   Nicholas Bari, MD  Note - This record has been created using Animal nutritionist.  Chart creation errors have been sought, but may not always  have been located. Such creation errors do not reflect on  the standard of medical care.

## 2024-01-07 ENCOUNTER — Ambulatory Visit: Payer: Medicare HMO | Admitting: Physician Assistant

## 2024-01-08 ENCOUNTER — Encounter: Payer: Self-pay | Admitting: Rheumatology

## 2024-01-08 ENCOUNTER — Ambulatory Visit: Payer: Medicare HMO | Attending: Rheumatology | Admitting: Rheumatology

## 2024-01-08 VITALS — BP 109/73 | HR 80 | Resp 16 | Ht 67.0 in | Wt 162.3 lb

## 2024-01-08 DIAGNOSIS — M19041 Primary osteoarthritis, right hand: Secondary | ICD-10-CM | POA: Diagnosis not present

## 2024-01-08 DIAGNOSIS — Z79899 Other long term (current) drug therapy: Secondary | ICD-10-CM | POA: Diagnosis not present

## 2024-01-08 DIAGNOSIS — E785 Hyperlipidemia, unspecified: Secondary | ICD-10-CM

## 2024-01-08 DIAGNOSIS — M19071 Primary osteoarthritis, right ankle and foot: Secondary | ICD-10-CM

## 2024-01-08 DIAGNOSIS — Z96643 Presence of artificial hip joint, bilateral: Secondary | ICD-10-CM

## 2024-01-08 DIAGNOSIS — Z96652 Presence of left artificial knee joint: Secondary | ICD-10-CM

## 2024-01-08 DIAGNOSIS — E0500 Thyrotoxicosis with diffuse goiter without thyrotoxic crisis or storm: Secondary | ICD-10-CM

## 2024-01-08 DIAGNOSIS — F17200 Nicotine dependence, unspecified, uncomplicated: Secondary | ICD-10-CM

## 2024-01-08 DIAGNOSIS — M1711 Unilateral primary osteoarthritis, right knee: Secondary | ICD-10-CM

## 2024-01-08 DIAGNOSIS — M0609 Rheumatoid arthritis without rheumatoid factor, multiple sites: Secondary | ICD-10-CM

## 2024-01-08 DIAGNOSIS — M19042 Primary osteoarthritis, left hand: Secondary | ICD-10-CM

## 2024-01-08 DIAGNOSIS — Z8669 Personal history of other diseases of the nervous system and sense organs: Secondary | ICD-10-CM

## 2024-01-08 DIAGNOSIS — M19072 Primary osteoarthritis, left ankle and foot: Secondary | ICD-10-CM

## 2024-01-08 DIAGNOSIS — Z8709 Personal history of other diseases of the respiratory system: Secondary | ICD-10-CM

## 2024-01-08 MED ORDER — ENBREL SURECLICK 50 MG/ML ~~LOC~~ SOAJ: 50.0000 mg | SUBCUTANEOUS | 0 refills | Status: DC

## 2024-01-08 NOTE — Patient Instructions (Signed)
 Standing Labs We placed an order today for your standing lab work.   Please have your standing labs drawn in July and every 3 months  Please have your labs drawn 2 weeks prior to your appointment so that the provider can discuss your lab results at your appointment, if possible.  Please note that you may see your imaging and lab results in MyChart before we have reviewed them. We will contact you once all results are reviewed. Please allow our office up to 72 hours to thoroughly review all of the results before contacting the office for clarification of your results.  WALK-IN LAB HOURS  Monday through Thursday from 8:00 am -12:30 pm and 1:00 pm-5:00 pm and Friday from 8:00 am-12:00 pm.  Patients with office visits requiring labs will be seen before walk-in labs.  You may encounter longer than normal wait times. Please allow additional time. Wait times may be shorter on  Monday and Thursday afternoons.  We do not book appointments for walk-in labs. We appreciate your patience and understanding with our staff.   Labs are drawn by [facility]. Please bring your co-pay at the time of your lab draw.  You may receive a bill from [facility] for your lab work.  Please note if you are on Hydroxychloroquine and and an order has been placed for a Hydroxychloroquine level,  you will need to have it drawn 4 hours or more after your last dose.  If you wish to have your labs drawn at another location, please call the office 24 hours in advance so we can fax the orders.  The office is located at [address], [address], [place], [place] [address]   If you have any questions regarding directions or hours of operation,  please call [phone].   As a reminder, please drink plenty of water prior to coming for your lab work. Thanks!  Vaccines You are taking a medication(s) that can suppress your immune system.  The following immunizations are recommended: Flu annually Covid-19  Td/Tdap (tetanus, diphtheria,  pertussis) every 10 years Pneumonia (Prevnar 15 then Pneumovax 23 at least 1 year apart.  Alternatively, can take Prevnar 20 without needing additional dose) Shingrix: 2 doses from 4 weeks to 6 months apart  Please check with your PCP to make sure you are up to date.   If you have signs or symptoms of an infection or start antibiotics: First, call your PCP for workup of your infection. Hold your medication through the infection, until you complete your antibiotics, and until symptoms resolve if you take the following: Injectable medication (Actemra, Benlysta, Cimzia, Cosentyx, Enbrel, Humira, Kevzara, Orencia, Remicade, Simponi, Stelara, Taltz, Tremfya) Methotrexate Leflunomide (Arava) Mycophenolate (Cellcept) Cloria Danger, Olumiant, or Rinvoq  Please get an annual skin examination to screen for skin cancer while you are on Enbrel.  Please use sunscreen and sun protection. .Hand Exercises Hand exercises can be helpful for almost anyone. They can strengthen your hands and improve flexibility and movement. The exercises can also increase blood flow to the hands. These results can make your work and daily tasks easier for you. Hand exercises can be especially helpful for people who have joint pain from arthritis or nerve damage from using their hands over and over. These exercises can also help people who injure a hand. Exercises Most of these hand exercises are gentle stretching and motion exercises. It is usually safe to do them often throughout the day. Warming up your hands before exercise may help reduce stiffness. You can  do this with gentle massage or by placing your hands in warm water for 10-15 minutes. It is normal to feel some stretching, pulling, tightness, or mild discomfort when you begin new exercises. In time, this will improve. Remember to always be careful and stop right away if you feel sudden, very bad pain or your pain gets worse. You want to get better and be safe. Ask your health  care provider which exercises are safe for you. Do exercises exactly as told by your provider and adjust them as told. Do not begin these exercises until told by your provider. Knuckle bend or "claw" fist  Stand or sit with your arm, hand, and all five fingers pointed straight up. Make sure to keep your wrist straight. Gently bend your fingers down toward your palm until the tips of your fingers are touching your palm. Keep your big knuckle straight and only bend the small knuckles in your fingers. Hold this position for 10 seconds. Straighten your fingers back to your starting position. Repeat this exercise 5-10 times with each hand. Full finger fist  Stand or sit with your arm, hand, and all five fingers pointed straight up. Make sure to keep your wrist straight. Gently bend your fingers into your palm until the tips of your fingers are touching the middle of your palm. Hold this position for 10 seconds. Extend your fingers back to your starting position, stretching every joint fully. Repeat this exercise 5-10 times with each hand. Straight fist  Stand or sit with your arm, hand, and all five fingers pointed straight up. Make sure to keep your wrist straight. Gently bend your fingers at the big knuckle, where your fingers meet your hand, and at the middle knuckle. Keep the knuckle at the tips of your fingers straight and try to touch the bottom of your palm. Hold this position for 10 seconds. Extend your fingers back to your starting position, stretching every joint fully. Repeat this exercise 5-10 times with each hand. Tabletop  Stand or sit with your arm, hand, and all five fingers pointed straight up. Make sure to keep your wrist straight. Gently bend your fingers at the big knuckle, where your fingers meet your hand, as far down as you can. Keep the small knuckles in your fingers straight. Think of forming a tabletop with your fingers. Hold this position for 10 seconds. Extend your  fingers back to your starting position, stretching every joint fully. Repeat this exercise 5-10 times with each hand. Finger spread  Place your hand flat on a table with your palm facing down. Make sure your wrist stays straight. Spread your fingers and thumb apart from each other as far as you can until you feel a gentle stretch. Hold this position for 10 seconds. Bring your fingers and thumb tight together again. Hold this position for 10 seconds. Repeat this exercise 5-10 times with each hand. Making circles  Stand or sit with your arm, hand, and all five fingers pointed straight up. Make sure to keep your wrist straight. Make a circle by touching the tip of your thumb to the tip of your index finger. Hold for 10 seconds. Then open your hand wide. Repeat this motion with your thumb and each of your fingers. Repeat this exercise 5-10 times with each hand. Thumb motion  Sit with your forearm resting on a table and your wrist straight. Your thumb should be facing up toward the ceiling. Keep your fingers relaxed as you move your thumb. Lift  your thumb up as high as you can toward the ceiling. Hold for 10 seconds. Bend your thumb across your palm as far as you can, reaching the tip of your thumb for the small finger (pinkie) side of your palm. Hold for 10 seconds. Repeat this exercise 5-10 times with each hand. Grip strengthening  Hold a stress ball or other soft ball in the middle of your hand. Slowly increase the pressure, squeezing the ball as much as you can without causing pain. Think of bringing the tips of your fingers into the middle of your palm. All of your finger joints should bend when doing this exercise. Hold your squeeze for 10 seconds, then relax. Repeat this exercise 5-10 times with each hand. Contact a health care provider if: Your hand pain or discomfort gets much worse when you do an exercise. Your hand pain or discomfort does not improve within 2 hours after you  exercise. If you have either of these problems, stop doing these exercises right away. Do not do them again unless your provider says that you can. Get help right away if: You develop sudden, severe hand pain or swelling. If this happens, stop doing these exercises right away. Do not do them again unless your provider says that you can. This information is not intended to replace advice given to you by your health care provider. Make sure you discuss any questions you have with your health care provider. Document Revised: 09/25/2022 Document Reviewed: 09/25/2022 Elsevier Patient Education  2024 [employer]  Exercises for Chronic Knee Pain Chronic knee pain is pain that lasts longer than 3 months. For most people with chronic knee pain, exercise and weight loss is an important part of treatment. Your health care provider may want you to focus on: Making the muscles that support your knee stronger. This can take pressure off your knee and reduce pain. Preventing knee stiffness. How far you can move your knee, keeping it there or making it farther. Losing weight (if this applies) to take pressure off your knee, lower your risk for injury, and make it easier for you to exercise. Your provider will help you make an exercise program that fits your needs and physical abilities. Below are simple, low-impact exercises you can do at home. Ask your provider or physical therapist how often you should do your exercise program and how many times to repeat each exercise. General safety tips  Get your provider's approval before doing any exercises. Start slowly and stop any time you feel pain. Do not exercise if your knee pain is flaring up. Warm up first. Stretching a cold muscle can cause an injury. Do 5-10 minutes of easy movement or light stretching before beginning your exercises. Do 5-10 minutes of low-impact activity (like walking or cycling) before starting strengthening exercises. Contact your  provider any time you have pain during or after exercising. Exercise can cause discomfort but should not be painful. It is normal to be a little stiff or sore after exercising. Stretching and range-of-motion exercises Front thigh stretch  Stand up straight and support your body by holding on to a chair or resting one hand on a wall. With your legs straight and close together, bend one knee to lift your heel up toward your butt. Using one hand for support, grab your ankle with your free hand. Pull your foot up closer toward your butt to feel the stretch in front of your thigh. Hold the stretch for 30 seconds. Repeat __________ times. Complete  this exercise __________ times a day. Back thigh stretch  Sit on the floor with your back straight and your legs out straight in front of you. Place the palms of your hands on the floor and slide them toward your feet as you bend at the hip. Try to touch your nose to your knees and feel the stretch in the back of your thighs. Hold for 30 seconds. Repeat __________ times. Complete this exercise __________ times a day. Calf stretch  Stand facing a wall. Place the palms of your hands flat against the wall, arms extended, and lean slightly against the wall. Get into a lunge position with one leg bent at the knee and the other leg stretched out straight behind you. Keep both feet facing the wall and increase the bend in your knee while keeping the heel of the other leg flat on the ground. You should feel the stretch in your calf. Hold for 30 seconds. Repeat __________ times. Complete this exercise __________ times a day. Strengthening exercises Straight leg lift  Lie on your back with one knee bent and the other leg out straight. Slowly lift the straight leg without bending the knee. Lift until your foot is about 12 inches (30 cm) off the floor. Hold for 3-5 seconds and slowly lower your leg. Repeat __________ times. Complete this exercise __________  times a day. Single leg dip  Stand between two chairs and put both hands on the backs of the chairs for support. Extend one leg out straight with your body weight resting on the heel of the standing leg. Slowly bend your standing knee to dip your body to the level that is comfortable for you. Hold for 3-5 seconds. Repeat __________ times. Complete this exercise __________ times a day. Hamstring curls  Stand straight, knees close together, facing the back of a chair. Hold on to the back of a chair with both hands. Keep one leg straight. Bend the other knee while bringing the heel up toward the butt until the knee is bent at a 90-degree angle (right angle). Hold for 3-5 seconds. Repeat __________ times. Complete this exercise __________ times a day. Wall squat  Stand straight with your back, hips, and head against a wall. Step forward one foot at a time with your back still against the wall. Your feet should be 2 feet (61 cm) from the wall at shoulder width. Keeping your back, hips, and head against the wall, slide down the wall to as close to a sitting position as you can get. Hold for 5-10 seconds, then slowly slide back up. Repeat __________ times. Complete this exercise __________ times a day. Step-ups  Stand in front of a sturdy platform or stool that is about 6 inches (15 cm) high. Slowly step up with your left / right foot, keeping your knee in line with your hip and foot. Do not let your knee bend so far that you cannot see your toes. Hold on to a chair for balance, but do not use it for support. Slowly unlock your knee and lower yourself to the starting position. Repeat __________ times. Complete this exercise __________ times a day. Contact a health care provider if: Your exercises cause pain. Your pain is worse after you exercise. Your pain prevents you from doing your exercises. This information is not intended to replace advice given to you by your health care provider.  Make sure you discuss any questions you have with your health care provider. Document Revised: 09/25/2022 Document  Reviewed: 09/25/2022 Elsevier Patient Education  2024 [employer].

## 2024-01-10 LAB — COMPREHENSIVE METABOLIC PANEL WITH GFR
AG Ratio: 1.6 (calc) (ref 1.0–2.5)
ALT: 15 U/L (ref 6–29)
AST: 21 U/L (ref 10–35)
Albumin: 4.6 g/dL (ref 3.6–5.1)
Alkaline phosphatase (APISO): 88 U/L (ref 37–153)
BUN: 8 mg/dL (ref 7–25)
CO2: 27 mmol/L (ref 20–32)
Calcium: 10.2 mg/dL (ref 8.6–10.4)
Chloride: 103 mmol/L (ref 98–110)
Creat: 0.73 mg/dL (ref 0.50–1.05)
Globulin: 2.8 g/dL (ref 1.9–3.7)
Glucose, Bld: 86 mg/dL (ref 65–99)
Potassium: 4.4 mmol/L (ref 3.5–5.3)
Sodium: 139 mmol/L (ref 135–146)
Total Bilirubin: 0.6 mg/dL (ref 0.2–1.2)
Total Protein: 7.4 g/dL (ref 6.1–8.1)
eGFR: 92 mL/min/{1.73_m2} (ref >=60)

## 2024-01-10 LAB — CBC WITH DIFFERENTIAL/PLATELET
Absolute Lymphocytes: 4316 {cells}/uL — ABNORMAL HIGH (ref 850–3900)
Absolute Monocytes: 762 {cells}/uL (ref 200–950)
Basophils Absolute: 82 {cells}/uL (ref 0–200)
Basophils Relative: 0.8 %
Eosinophils Absolute: 144 {cells}/uL (ref 15–500)
Eosinophils Relative: 1.4 %
HCT: 45.6 % — ABNORMAL HIGH (ref 35.0–45.0)
Hemoglobin: 15.2 g/dL (ref 11.7–15.5)
MCH: 29.5 pg (ref 27.0–33.0)
MCHC: 33.3 g/dL (ref 32.0–36.0)
MCV: 88.4 fL (ref 80.0–100.0)
MPV: 11.2 fL (ref 7.5–12.5)
Monocytes Relative: 7.4 %
Neutro Abs: 4996 {cells}/uL (ref 1500–7800)
Neutrophils Relative %: 48.5 %
Platelets: 212 10*3/uL (ref 140–400)
RBC: 5.16 10*6/uL — ABNORMAL HIGH (ref 3.80–5.10)
RDW: 13.3 % (ref 11.0–15.0)
Total Lymphocyte: 41.9 %
WBC: 10.3 10*3/uL (ref 3.8–10.8)

## 2024-01-10 LAB — QUANTIFERON-TB GOLD PLUS
Mitogen-NIL: 7.64 [IU]/mL
NIL: 0.05 [IU]/mL
QuantiFERON-TB Gold Plus: NEGATIVE
TB1-NIL: 0 [IU]/mL
TB2-NIL: 0 [IU]/mL

## 2024-01-10 LAB — LIPID PANEL
Cholesterol: 171 mg/dL (ref <200)
HDL: 71 mg/dL (ref >=50)
LDL Cholesterol (Calc): 79 mg/dL
Non-HDL Cholesterol (Calc): 100 mg/dL (ref <130)
Total CHOL/HDL Ratio: 2.4 (calc) (ref <5.0)
Triglycerides: 111 mg/dL (ref <150)

## 2024-03-25 NOTE — Progress Notes (Unsigned)
 Office Visit Note  Patient: Leslie Brewer             Date of Birth: December 24, 1960           MRN: [id number]             PCP: Maree Leni Edyth DELENA, MD Referring: Maree Leni Edyth DELENA, MD Visit Date: 04/08/2024 Occupation: @GUAROCC @  Subjective:  Medication monitoring  History of Present Illness: Leslie Brewer is a 63 y.o. female with history of seronegative rheumatoid arthritis.  Patient remains on enbrel  50 mg sq injections once weekly.  She is tolerating Enbrel  without any side effects or injection site reactions.  She has not had any recent gaps in therapy.  She denies any signs or symptoms of a rheumatoid arthritis flare.  Patient states that she has had some weight gain which she feels is contributing to some of her increased aches and pains.  Patient states that she would like to try to lose about 20 pounds.  Patient plans on getting a stationary bike to increase her activity level.  Patient states she has had some increased soreness in both ankle joints and has been using a cane to assist with ambulation.  She denies any recent falls.  She denies any new medical conditions.  Patient states that she is scheduled for mammogram next week and will also be scheduling an up-to-date colonoscopy.  She denies any recent or recurrent infections.   Activities of Daily Living:  Patient reports morning stiffness for 10 minutes.   Patient Reports nocturnal pain.  Difficulty dressing/grooming: Reports Difficulty climbing stairs: Reports Difficulty getting out of chair: Denies Difficulty using hands for taps, buttons, cutlery, and/or writing: Reports  Review of Systems  Constitutional:  Negative for fatigue.  HENT:  Negative for mouth sores and mouth dryness.   Eyes:  Positive for dryness.  Respiratory:  Negative for shortness of breath.   Cardiovascular:  Negative for chest pain and palpitations.  Gastrointestinal:  Positive for constipation. Negative for blood in stool and diarrhea.  Endocrine:  Negative for increased urination.  Genitourinary:  Negative for involuntary urination.  Musculoskeletal:  Positive for joint pain, gait problem, joint pain and morning stiffness. Negative for joint swelling, myalgias, muscle weakness, muscle tenderness and myalgias.  Skin:  Negative for color change, rash, hair loss and sensitivity to sunlight.  Allergic/Immunologic: Negative for susceptible to infections.  Neurological:  Positive for dizziness. Negative for headaches.  Hematological:  Negative for swollen glands.  Psychiatric/Behavioral:  Positive for depressed mood and sleep disturbance. The patient is not nervous/anxious.     PMFS History:  Patient Active Problem List   Diagnosis Date Noted   Rheumatoid arthritis involving multiple sites with positive rheumatoid factor (HCC) 02/23/2021   H/O bilateral hip replacements 02/23/2021   History of total left knee replacement 02/23/2021    Past Medical History:  Diagnosis Date   Guillain Barr syndrome (HCC)    History of Graves' disease    Rheumatoid arthritis (HCC)     Family History  Problem Relation Age of Onset   Cancer Mother    Healthy Daughter    Past Surgical History:  Procedure Laterality Date   APPENDECTOMY     CATARACT EXTRACTION Bilateral 2024   REPLACEMENT TOTAL KNEE Left 2020   TOTAL HIP ARTHROPLASTY Right    TOTAL HIP ARTHROPLASTY Left    TUBAL LIGATION     Social History   Social History Narrative   Not on file  Immunization History  Administered Date(s) Administered   Moderna Sars-Covid-2 Vaccination 01/01/2020, 01/29/2020, 08/29/2020, 06/13/2021     Objective: Vital Signs: BP 117/79 (BP Location: Left Arm, Patient Position: Sitting, Cuff Size: Normal)   Pulse 69   Resp 16   Ht 5' 7 (1.702 m)   Wt 172 lb (78 kg)   BMI 26.94 kg/m    Physical Exam Vitals and nursing note reviewed.  Constitutional:      Appearance: She is well-developed.  HENT:     Head: Normocephalic and atraumatic.  Eyes:      Conjunctiva/sclera: Conjunctivae normal.  Cardiovascular:     Rate and Rhythm: Normal rate and regular rhythm.     Heart sounds: Normal heart sounds.  Pulmonary:     Effort: Pulmonary effort is normal.     Breath sounds: Normal breath sounds.  Abdominal:     General: Bowel sounds are normal.     Palpations: Abdomen is soft.  Musculoskeletal:     Cervical back: Normal range of motion.  Lymphadenopathy:     Cervical: No cervical adenopathy.  Skin:    General: Skin is warm and dry.     Capillary Refill: Capillary refill takes less than 2 seconds.  Neurological:     Mental Status: She is alert and oriented to person, place, and time.  Psychiatric:        Behavior: Behavior normal.      Musculoskeletal Exam: Cervical spine, thoracic spine, lumbar spine good range of motion with no discomfort.  Shoulder joints, elbow joints, wrist joints, MCPs, PIPs, DIPs have good range of motion with no synovitis.  PIP and DIP thickening noted.  Bilateral hip replacements have slightly limited range of motion.  Left knee replacement has limited extension but no effusion noted.  Right knee has slightly limited extension but no warmth or effusion noted today.  Ankle joints have good range of motion with some tenderness bilaterally.  No synovitis noted.  CDAI Exam: CDAI Score: -- Patient Global: --; Provider Global: -- Swollen: --; Tender: -- Joint Exam 04/08/2024   No joint exam has been documented for this visit   There is currently no information documented on the homunculus. Go to the Rheumatology activity and complete the homunculus joint exam.  Investigation: No additional findings.  Imaging: No results found.  Recent Labs: Lab Results  Component Value Date   WBC 10.3 01/08/2024   HGB 15.2 01/08/2024   PLT 212 01/08/2024   NA 139 01/08/2024   K 4.4 01/08/2024   CL 103 01/08/2024   CO2 27 01/08/2024   GLUCOSE 86 01/08/2024   BUN 8 01/08/2024   CREATININE 0.73 01/08/2024    BILITOT 0.6 01/08/2024   ALKPHOS 72 06/27/2022   AST 21 01/08/2024   ALT 15 01/08/2024   PROT 7.4 01/08/2024   ALBUMIN 4.4 06/27/2022   CALCIUM 10.2 01/08/2024   GFRAA 101 02/23/2021   QFTBGOLDPLUS NEGATIVE 01/08/2024    Speciality Comments: dxd in [place] 17 years ago.  Treated with prednisone many years, Enbrel  for 6 years off and on  Procedures:  No procedures performed Allergies: Latex   Assessment / Plan:     Visit Diagnoses: Rheumatoid arthritis of multiple sites with negative rheumatoid factor (HCC) - RF negative, anti-CCP negative, positive ANA.  Intermittent treatment with Enbrel  since 2019: She has no synovitis on examination today.  She has not had any signs or symptoms of a rheumatoid arthritis flare.  She has clinically been doing well on Enbrel  50  mg sq injections once weekly.  She is tolerating Enbrel  without any side effects or injection site reactions.  She has not had any recent gaps in therapy.  No recent or recurrent infections.  Patient is hoping to get a stationary bike to increase her activity level while at home.  Patient states that she would like to lose about 20 pounds which she feels will help with her general arthralgias and joint stiffness.  She will remain on Enbrel  as monotherapy.  She was advised to notify us  if she develops any signs or symptoms of a flare.  She will follow-up in the office in 3 months or sooner if needed.  High risk medication use - Enbrel  50 mg sq injections once weekly.  CBC and CMP updated on 01/08/24. Orders for CBC and CMP released today.  TB gold negative on 01/08/24.  Lipid panel updated on 01/08/24.  Discussed the importance of holding enbrel  if she develops signs or symptoms of an infection and to resume once the infection has completely cleared.   - Plan: CBC with Differential/Platelet, Comprehensive metabolic panel with GFR  Primary osteoarthritis of both hands: PIP and DIP thickening.  No synovitis noted.  Complete fist formation  bilaterally.  She experiences some increase soreness and stiffness in both hands with repetitive or overuse activities.  H/O bilateral hip replacements: Slightly limited ROM-no groin pain.  Primary osteoarthritis of right knee: Slightly limited extension.  No warmth or effusion noted.  History of total left knee replacement: Doing well.  Limited extension.  No effusion noted.  Using a cane to assist with ambulation.   Primary osteoarthritis of both feet:She is having increased discomfort in both ankles.  No synovitis noted. She has been using a cane to assist with ambulation.   She is planning on getting a stationary bike to increase her exercise regimen.  Other medical conditions are listed as follows:   Dyslipidemia: Lipid panel within normal limits on 01/08/2024.  Graves' ophthalmopathy  H/O Guillain-Barre syndrome  History of COPD  Smoker  Orders: Orders Placed This Encounter  Procedures   CBC with Differential/Platelet   Comprehensive metabolic panel with GFR   No orders of the defined types were placed in this encounter.    Follow-Up Instructions: Return in about 3 months (around 07/09/2024) for Rheumatoid arthritis.   Waddell CHRISTELLA Craze, PA-C  Note - This record has been created using Dragon software.  Chart creation errors have been sought, but may not always  have been located. Such creation errors do not reflect on  the standard of medical care.

## 2024-04-08 ENCOUNTER — Encounter: Payer: Self-pay | Admitting: Physician Assistant

## 2024-04-08 ENCOUNTER — Ambulatory Visit: Attending: Physician Assistant | Admitting: Physician Assistant

## 2024-04-08 ENCOUNTER — Other Ambulatory Visit: Payer: Self-pay

## 2024-04-08 VITALS — BP 117/79 | HR 69 | Resp 16 | Ht 67.0 in | Wt 172.0 lb

## 2024-04-08 DIAGNOSIS — Z96652 Presence of left artificial knee joint: Secondary | ICD-10-CM

## 2024-04-08 DIAGNOSIS — M19041 Primary osteoarthritis, right hand: Secondary | ICD-10-CM

## 2024-04-08 DIAGNOSIS — Z96643 Presence of artificial hip joint, bilateral: Secondary | ICD-10-CM

## 2024-04-08 DIAGNOSIS — Z79899 Other long term (current) drug therapy: Secondary | ICD-10-CM | POA: Diagnosis not present

## 2024-04-08 DIAGNOSIS — M0609 Rheumatoid arthritis without rheumatoid factor, multiple sites: Secondary | ICD-10-CM

## 2024-04-08 DIAGNOSIS — M19071 Primary osteoarthritis, right ankle and foot: Secondary | ICD-10-CM

## 2024-04-08 DIAGNOSIS — E0500 Thyrotoxicosis with diffuse goiter without thyrotoxic crisis or storm: Secondary | ICD-10-CM

## 2024-04-08 DIAGNOSIS — Z8669 Personal history of other diseases of the nervous system and sense organs: Secondary | ICD-10-CM

## 2024-04-08 DIAGNOSIS — M1711 Unilateral primary osteoarthritis, right knee: Secondary | ICD-10-CM

## 2024-04-08 DIAGNOSIS — E785 Hyperlipidemia, unspecified: Secondary | ICD-10-CM

## 2024-04-08 DIAGNOSIS — M19042 Primary osteoarthritis, left hand: Secondary | ICD-10-CM

## 2024-04-08 DIAGNOSIS — Z8709 Personal history of other diseases of the respiratory system: Secondary | ICD-10-CM

## 2024-04-08 DIAGNOSIS — M19072 Primary osteoarthritis, left ankle and foot: Secondary | ICD-10-CM

## 2024-04-08 DIAGNOSIS — F17200 Nicotine dependence, unspecified, uncomplicated: Secondary | ICD-10-CM

## 2024-04-08 LAB — CBC WITH DIFFERENTIAL/PLATELET
Absolute Lymphocytes: 3525 {cells}/uL (ref 850–3900)
Absolute Monocytes: 1043 {cells}/uL — ABNORMAL HIGH (ref 200–950)
Basophils Absolute: 66 {cells}/uL (ref 0–200)
Basophils Relative: 0.7 %
Eosinophils Absolute: 122 {cells}/uL (ref 15–500)
Eosinophils Relative: 1.3 %
HCT: 43.9 % (ref 35.0–45.0)
Hemoglobin: 13.7 g/dL (ref 11.7–15.5)
MCH: 29 pg (ref 27.0–33.0)
MCHC: 31.2 g/dL — ABNORMAL LOW (ref 32.0–36.0)
MCV: 93 fL (ref 80.0–100.0)
MPV: 11.1 fL (ref 7.5–12.5)
Monocytes Relative: 11.1 %
Neutro Abs: 4644 {cells}/uL (ref 1500–7800)
Neutrophils Relative %: 49.4 %
Platelets: 199 Thousand/uL (ref 140–400)
RBC: 4.72 Million/uL (ref 3.80–5.10)
RDW: 13.4 % (ref 11.0–15.0)
Total Lymphocyte: 37.5 %
WBC: 9.4 Thousand/uL (ref 3.8–10.8)

## 2024-04-08 LAB — COMPREHENSIVE METABOLIC PANEL WITH GFR
AG Ratio: 1.8 (calc) (ref 1.0–2.5)
ALT: 15 U/L (ref 6–29)
AST: 22 U/L (ref 10–35)
Albumin: 4.6 g/dL (ref 3.6–5.1)
Alkaline phosphatase (APISO): 90 U/L (ref 37–153)
BUN: 7 mg/dL (ref 7–25)
CO2: 25 mmol/L (ref 20–32)
Calcium: 9.9 mg/dL (ref 8.6–10.4)
Chloride: 105 mmol/L (ref 98–110)
Creat: 0.71 mg/dL (ref 0.50–1.05)
Globulin: 2.5 g/dL (ref 1.9–3.7)
Glucose, Bld: 83 mg/dL (ref 65–99)
Potassium: 4.7 mmol/L (ref 3.5–5.3)
Sodium: 139 mmol/L (ref 135–146)
Total Bilirubin: 0.6 mg/dL (ref 0.2–1.2)
Total Protein: 7.1 g/dL (ref 6.1–8.1)
eGFR: 95 mL/min/1.73m2 (ref >=60)

## 2024-04-08 MED ORDER — ENBREL SURECLICK 50 MG/ML ~~LOC~~ SOAJ: 50.0000 mg | SUBCUTANEOUS | 0 refills | Status: DC

## 2024-04-08 NOTE — Telephone Encounter (Signed)
 Please review and sign

## 2024-04-09 ENCOUNTER — Ambulatory Visit: Payer: Self-pay | Admitting: Physician Assistant

## 2024-04-09 NOTE — Progress Notes (Signed)
 CMP WNL Absolute monocytes are slightly elevated. Rest of CBC WNL.  We will continue to monitor. No change in treatment recommended at this time.

## 2024-06-26 NOTE — Progress Notes (Signed)
 Office Visit Note  Patient: Leslie Brewer             Date of Birth: [birth year]           MRN: [id number]             PCP: Maree Leni Edyth DELENA, MD Referring: Maree Leni Edyth DELENA, MD Visit Date: 07/10/2024 Occupation: Data Unavailable  Subjective:  Medication monitoring  History of Present Illness: Leslie Brewer is a 63 y.o. female with history of seronegative rheumatoid arthritis.  Paitent remains on enbrel  50 mg sq injections once weekly.  She continues to tolerate Enbrel  without any side effects and has not had any gaps in therapy.  She denies any signs or symptoms of a rheumatoid arthritis flare.  Her morning stiffness has been lasting for about 15 minutes daily.  She denies any joint swelling at this time.  She has been trying to perform strengthening and stretching exercises daily.  She experiences intermittent discomfort in the left knee replacement as well as occasional discomfort in the right knee especially at night.  She uses a cane to assist with ambulation.  Patient states both hip replacements are doing well. She denies any recent or recurrent infections. She is planning to proceed with a DEXA in November 2025.   Activities of Daily Living:  Patient reports morning stiffness for 15 minutes.   Patient Reports nocturnal pain.  Difficulty dressing/grooming: Reports Difficulty climbing stairs: Reports Difficulty getting out of chair: Reports Difficulty using hands for taps, buttons, cutlery, and/or writing: Reports  Review of Systems  Constitutional:  Negative for fatigue.  HENT:  Negative for mouth sores and mouth dryness.   Eyes:  Positive for dryness.  Respiratory:  Positive for shortness of breath.   Cardiovascular:  Negative for chest pain and palpitations.  Gastrointestinal:  Positive for constipation. Negative for blood in stool and diarrhea.  Endocrine: Positive for increased urination.  Genitourinary:  Negative for involuntary urination.  Musculoskeletal:   Positive for joint pain, gait problem, joint pain, joint swelling, myalgias, morning stiffness and myalgias. Negative for muscle weakness and muscle tenderness.  Skin:  Positive for sensitivity to sunlight. Negative for color change, rash and hair loss.  Allergic/Immunologic: Negative for susceptible to infections.  Neurological:  Positive for dizziness. Negative for headaches.  Hematological:  Negative for swollen glands.  Psychiatric/Behavioral:  Positive for depressed mood and sleep disturbance. The patient is nervous/anxious.     PMFS History:  Patient Active Problem List   Diagnosis Date Noted   Rheumatoid arthritis involving multiple sites with positive rheumatoid factor (HCC) 02/23/2021   H/O bilateral hip replacements 02/23/2021   History of total left knee replacement 02/23/2021    Past Medical History:  Diagnosis Date   Guillain Barr syndrome    History of Graves' disease    Hyperlipidemia    Rheumatoid arthritis (HCC)     Family History  Problem Relation Age of Onset   Cancer Mother    Healthy Daughter    Past Surgical History:  Procedure Laterality Date   APPENDECTOMY     CATARACT EXTRACTION Bilateral 2024   REPLACEMENT TOTAL KNEE Left 2020   TOTAL HIP ARTHROPLASTY Right    TOTAL HIP ARTHROPLASTY Left    TUBAL LIGATION     Social History   Tobacco Use   Smoking status: Former    Current packs/day: 1.00    Average packs/day: 1 pack/day for 30.0 years (30.0 ttl pk-yrs)    Types: Cigarettes  Passive exposure: Past   Smokeless tobacco: Never  Vaping Use   Vaping status: Former  Substance Use Topics   Alcohol use: Not Currently    Comment: occ   Drug use: Not Currently   Social History   Social History Narrative   Not on file     Immunization History  Administered Date(s) Administered   Moderna Sars-Covid-2 Vaccination 01/01/2020, 01/29/2020, 08/29/2020, 06/13/2021     Objective: Vital Signs: BP 121/75   Pulse 71   Temp (!) 97 F (36.1 C)    Resp 12   Ht 5' 7 (1.702 m)   Wt 173 lb 3.2 oz (78.6 kg)   BMI 27.13 kg/m    Physical Exam Vitals and nursing note reviewed.  Constitutional:      Appearance: She is well-developed.  HENT:     Head: Normocephalic and atraumatic.  Eyes:     Conjunctiva/sclera: Conjunctivae normal.  Cardiovascular:     Rate and Rhythm: Normal rate and regular rhythm.     Heart sounds: Normal heart sounds.  Pulmonary:     Effort: Pulmonary effort is normal.     Breath sounds: Normal breath sounds.  Abdominal:     General: Bowel sounds are normal.     Palpations: Abdomen is soft.  Musculoskeletal:     Cervical back: Normal range of motion.  Lymphadenopathy:     Cervical: No cervical adenopathy.  Skin:    General: Skin is warm and dry.     Capillary Refill: Capillary refill takes less than 2 seconds.  Neurological:     Mental Status: She is alert and oriented to person, place, and time.  Psychiatric:        Behavior: Behavior normal.      Musculoskeletal Exam:C-spine, thoracic spine, lumbar spine have good range of motion.  No midline spinal tenderness.  No SI joint tenderness.  Shoulder joints and elbow joint have good ROM.  Limited extension of both wrists. MCPs, PIPs, DIPs have good range of motion with no synovitis.  Complete fist formation bilaterally.  Hip replacements have slightly limited range of motion with no groin pain.  Left knee replacement has limited extension.  Right knee joints have good range of motion no warmth or effusion.  Ankle joints have good range of motion no tenderness or joint swelling.    CDAI Exam: CDAI Score: -- Patient Global: --; Provider Global: -- Swollen: --; Tender: -- Joint Exam 07/10/2024   No joint exam has been documented for this visit   There is currently no information documented on the homunculus. Go to the Rheumatology activity and complete the homunculus joint exam.  Investigation: No additional findings.  Imaging: No results  found.  Recent Labs: Lab Results  Component Value Date   WBC 9.4 04/08/2024   HGB 13.7 04/08/2024   PLT 199 04/08/2024   NA 139 04/08/2024   K 4.7 04/08/2024   CL 105 04/08/2024   CO2 25 04/08/2024   GLUCOSE 83 04/08/2024   BUN 7 04/08/2024   CREATININE 0.71 04/08/2024   BILITOT 0.6 04/08/2024   ALKPHOS 72 06/27/2022   AST 22 04/08/2024   ALT 15 04/08/2024   PROT 7.1 04/08/2024   ALBUMIN 4.4 06/27/2022   CALCIUM 9.9 04/08/2024   GFRAA 101 02/23/2021   QFTBGOLDPLUS NEGATIVE 01/08/2024    Speciality Comments: dxd in [place] 17 years ago.  Treated with prednisone many years, Enbrel  for 6 years off and on  Procedures:  No procedures performed Allergies: Latex  Assessment / Plan:     Visit Diagnoses: Rheumatoid arthritis of multiple sites with negative rheumatoid factor (HCC) - - RF negative, anti-CCP negative, positive ANA.  Intermittent treatment with Enbrel  since 2019: She has no synovitis on examination today.  She has not had any signs or symptoms of a rheumatoid arthritis flare.  She has intermittent joint stiffness but overall her symptoms have been manageable.  She has clinically been doing well on Enbrel  50 mg sq injections once weekly.  She is tolerating Enbrel  without any side effects or injection site reactions.  She has not had any gaps in therapy.  No medication changes will be made at this time.  She was advised to notify us  if she develops signs or symptoms of a flare.  She will follow-up in the office in 5 months or sooner if needed. Association of heart disease with rheumatoid arthritis was discussed. Need to monitor blood pressure, cholesterol, and to exercise 30-60 minutes on daily basis was discussed.  She has quit smoking cigarettes.  She has been trying to reduce excess sugar intake.  She has also been started on Lipitor by her PCP.  Plan to recheck lipid panel today as requested.    - Plan: Lipid panel  High risk medication use - Enbrel  50 mg sq  injections once weekly.  CBC and CMP updated on 04/08/24. Orders for CBC and CMP released today.  Her next lab work will be due in January and every 3 months to monitor for drug toxicity. Lipid panel WNL on 01/08/24. She has been started on Lipitor and has been taking it as prescribed.  She would like to have a updated lipid panel today. TB gold negative on 01/08/24.  No recent or recurrent infections.  Discussed the importance of holding enbrel  if she develops signs or symptoms of an infection and to resume once the infection has completely cleared.  - Plan: CBC with Differential/Platelet, Comprehensive metabolic panel with GFR, Lipid panel  Primary osteoarthritis of both hands: No tenderness or synovitis noted today.  H/O bilateral hip replacements: Slightly limited range of motion  Primary osteoarthritis of right knee: Intermittent discomfort especially at night.  She has good range of motion on examination today with no effusion noted.  She uses a cane to assist with ambulation.  She has been working on stretching and strengthening exercises.  She is also working on weight loss.  She has tried to eliminate excess sugar from her diet.  History of total left knee replacement: Intermittent discomfort.  Limited extension.  Using a cane to assist with ambulation.  Primary osteoarthritis of both feet: She has good range of motion of both ankle joints with no tenderness or joint swelling.  Osteopenia of left forearm: DEXA on 07/31/2022:The BMD measured at Forearm Radius 33% is 0.688 g/cm2 with a T-score of -2.1.  She is taking a calcium supplement and vitamin D 2000 units daily.  Due to update DEXA next month--Future order will be placed today.  Dyslipidemia -Lipid panel updated today.  Plan: Lipid panel  Other medical conditions are listed as follows:  Graves' ophthalmopathy  H/O Guillain-Barre syndrome  History of COPD  Smoker  Elevated CK: No increased muscular weakness.  She has been  performing strengthening and stretching exercises daily.  Orders: Orders Placed This Encounter  Procedures   CBC with Differential/Platelet   Comprehensive metabolic panel with GFR   Lipid panel   No orders of the defined types were placed in this encounter.  Follow-Up Instructions: Return in about 3 months (around 10/10/2024) for Rheumatoid arthritis.   Waddell CHRISTELLA Craze, PA-C  Note - This record has been created using Dragon software.  Chart creation errors have been sought, but may not always  have been located. Such creation errors do not reflect on  the standard of medical care.

## 2024-07-09 ENCOUNTER — Telehealth: Payer: Self-pay | Admitting: Pharmacist

## 2024-07-09 NOTE — Telephone Encounter (Addendum)
 Enbrel  PAP renewal application prepped for OV on 07/10/2024. Provider form placed in Waddell Craze, PA-C's folder for signature  Will need updated PA to be submitted with renewal application  Sherry Pennant, PharmD, MPH, BCPS, CPP Clinical Pharmacist [facility])

## 2024-07-10 ENCOUNTER — Ambulatory Visit: Attending: Physician Assistant | Admitting: Physician Assistant

## 2024-07-10 ENCOUNTER — Encounter: Payer: Self-pay | Admitting: Physician Assistant

## 2024-07-10 ENCOUNTER — Other Ambulatory Visit: Payer: Self-pay

## 2024-07-10 ENCOUNTER — Other Ambulatory Visit (HOSPITAL_COMMUNITY): Payer: Self-pay

## 2024-07-10 VITALS — BP 121/75 | HR 71 | Temp 97.0°F | Resp 12 | Ht 67.0 in | Wt 173.2 lb

## 2024-07-10 DIAGNOSIS — Z8669 Personal history of other diseases of the nervous system and sense organs: Secondary | ICD-10-CM

## 2024-07-10 DIAGNOSIS — Z79899 Other long term (current) drug therapy: Secondary | ICD-10-CM | POA: Diagnosis not present

## 2024-07-10 DIAGNOSIS — Z8709 Personal history of other diseases of the respiratory system: Secondary | ICD-10-CM

## 2024-07-10 DIAGNOSIS — M0609 Rheumatoid arthritis without rheumatoid factor, multiple sites: Secondary | ICD-10-CM

## 2024-07-10 DIAGNOSIS — M1711 Unilateral primary osteoarthritis, right knee: Secondary | ICD-10-CM

## 2024-07-10 DIAGNOSIS — Z96643 Presence of artificial hip joint, bilateral: Secondary | ICD-10-CM

## 2024-07-10 DIAGNOSIS — H05839 Thyroid orbitopathy, unspecified orbit: Secondary | ICD-10-CM

## 2024-07-10 DIAGNOSIS — M85832 Other specified disorders of bone density and structure, left forearm: Secondary | ICD-10-CM

## 2024-07-10 DIAGNOSIS — M19042 Primary osteoarthritis, left hand: Secondary | ICD-10-CM

## 2024-07-10 DIAGNOSIS — E785 Hyperlipidemia, unspecified: Secondary | ICD-10-CM

## 2024-07-10 DIAGNOSIS — M19041 Primary osteoarthritis, right hand: Secondary | ICD-10-CM | POA: Diagnosis not present

## 2024-07-10 DIAGNOSIS — M19072 Primary osteoarthritis, left ankle and foot: Secondary | ICD-10-CM

## 2024-07-10 DIAGNOSIS — E0500 Thyrotoxicosis with diffuse goiter without thyrotoxic crisis or storm: Secondary | ICD-10-CM

## 2024-07-10 DIAGNOSIS — R748 Abnormal levels of other serum enzymes: Secondary | ICD-10-CM

## 2024-07-10 DIAGNOSIS — Z96652 Presence of left artificial knee joint: Secondary | ICD-10-CM

## 2024-07-10 DIAGNOSIS — F17200 Nicotine dependence, unspecified, uncomplicated: Secondary | ICD-10-CM

## 2024-07-10 DIAGNOSIS — M19071 Primary osteoarthritis, right ankle and foot: Secondary | ICD-10-CM

## 2024-07-10 NOTE — Progress Notes (Unsigned)
 Patient is okay with [facility]. Patient requests a mychart message with the phone number to schedule. Will send patient a message. Please review and sign.

## 2024-07-10 NOTE — Telephone Encounter (Signed)
 Provider and pt forms have both been signed. Completed remaining sections and printed supporting documents. Per automated system, it appears that there will be a new application that will need to be completed on/after 08/17/24. Will set aside for now.

## 2024-07-11 LAB — CBC WITH DIFFERENTIAL/PLATELET
Absolute Lymphocytes: 3739 {cells}/uL (ref 850–3900)
Absolute Monocytes: 746 {cells}/uL (ref 200–950)
Basophils Absolute: 74 {cells}/uL (ref 0–200)
Basophils Relative: 0.9 %
Eosinophils Absolute: 90 {cells}/uL (ref 15–500)
Eosinophils Relative: 1.1 %
HCT: 40.4 % (ref 35.0–45.0)
Hemoglobin: 12.9 g/dL (ref 11.7–15.5)
MCH: 29.3 pg (ref 27.0–33.0)
MCHC: 31.9 g/dL — ABNORMAL LOW (ref 32.0–36.0)
MCV: 91.8 fL (ref 80.0–100.0)
MPV: 11.7 fL (ref 7.5–12.5)
Monocytes Relative: 9.1 %
Neutro Abs: 3551 {cells}/uL (ref 1500–7800)
Neutrophils Relative %: 43.3 %
Platelets: 211 Thousand/uL (ref 140–400)
RBC: 4.4 Million/uL (ref 3.80–5.10)
RDW: 13 % (ref 11.0–15.0)
Total Lymphocyte: 45.6 %
WBC: 8.2 Thousand/uL (ref 3.8–10.8)

## 2024-07-11 LAB — COMPREHENSIVE METABOLIC PANEL WITH GFR
AG Ratio: 2 (calc) (ref 1.0–2.5)
ALT: 25 U/L (ref 6–29)
AST: 20 U/L (ref 10–35)
Albumin: 4.5 g/dL (ref 3.6–5.1)
Alkaline phosphatase (APISO): 91 U/L (ref 37–153)
BUN: 9 mg/dL (ref 7–25)
CO2: 28 mmol/L (ref 20–32)
Calcium: 9.7 mg/dL (ref 8.6–10.4)
Chloride: 105 mmol/L (ref 98–110)
Creat: 0.73 mg/dL (ref 0.50–1.05)
Globulin: 2.3 g/dL (ref 1.9–3.7)
Glucose, Bld: 79 mg/dL (ref 65–99)
Potassium: 3.8 mmol/L (ref 3.5–5.3)
Sodium: 141 mmol/L (ref 135–146)
Total Bilirubin: 0.7 mg/dL (ref 0.2–1.2)
Total Protein: 6.8 g/dL (ref 6.1–8.1)
eGFR: 92 mL/min/1.73m2 (ref >=60)

## 2024-07-11 LAB — LIPID PANEL
Cholesterol: 112 mg/dL (ref <200)
HDL: 64 mg/dL (ref >=50)
LDL Cholesterol (Calc): 33 mg/dL
Non-HDL Cholesterol (Calc): 48 mg/dL (ref <130)
Total CHOL/HDL Ratio: 1.8 (calc) (ref <5.0)
Triglycerides: 71 mg/dL (ref <150)

## 2024-07-13 ENCOUNTER — Ambulatory Visit: Payer: Self-pay | Admitting: Physician Assistant

## 2024-07-13 NOTE — Progress Notes (Signed)
CBC and CMP WNL  Lipid panel WNL

## 2024-07-27 ENCOUNTER — Other Ambulatory Visit: Payer: Self-pay | Admitting: Rheumatology

## 2024-07-27 DIAGNOSIS — M0609 Rheumatoid arthritis without rheumatoid factor, multiple sites: Secondary | ICD-10-CM

## 2024-07-27 MED ORDER — ENBREL SURECLICK 50 MG/ML ~~LOC~~ SOAJ: 50.0000 mg | SUBCUTANEOUS | 0 refills | Status: DC

## 2024-07-27 NOTE — Telephone Encounter (Signed)
 Last Fill: 04/08/2024  Labs: 07/10/2024 CBC and CMP WNL Lipid panel WNL  TB Gold: 01/08/2024 negative    Next Visit: 10/13/2024  Last Visit: 07/10/2024  IK:Myzlfjunpi arthritis of multiple sites with negative rheumatoid factor   Current Dose per office note on 07/10/2024: Enbrel  50 mg sq injections once weekly.   Okay to refill Enbrel ?

## 2024-07-27 NOTE — Telephone Encounter (Signed)
 Patient contacted the office to request a medication refill.   1. Name of Medication: Enbrel   2. How are you currently taking this medication (dosage and times per day)? Once a week   3. What pharmacy would you like for that to be sent to? [facility]

## 2024-08-17 NOTE — Telephone Encounter (Signed)
 Transcribed Enbrel  PAP forms to updated [employer] Operational Officer form placed in Waddell Craze, PA-C's folder for signature  Sherry Pennant, PharmD, MPH, BCPS, CPP Clinical Pharmacist [facility])

## 2024-08-18 NOTE — Telephone Encounter (Signed)
 Received notification from [employer] regarding a prior authorization for ENBREL . Authorization has been APPROVED from 08/17/2024 to 09/23/2025. Approval letter sent to scan center.  Submitted Patient Assistance RENEWAL Application to [employer] for ENBREL  with patient portion, provider portion, insurance card copy, prior authorization approval, and current medication list. Will update patient when we receive a response.  Phone #: [phone] Fax #: [phone]

## 2024-08-18 NOTE — Telephone Encounter (Signed)
 Received signed provider from Waddell Craze, PA-C. Placed in PAP pending info folder in [place]'s office   Submitted a Prior Authorization renewal request to [employer] for ENBREL  via CoverMyMeds. Will update once we receive a response.  Key: [id number]     Sherry Pennant, PharmD, MPH, BCPS, CPP Clinical Pharmacist [facility])

## 2024-09-22 NOTE — Telephone Encounter (Signed)
 Per automated system at [employer], all 2026 applications will be processed in 2026 (determination will be made no sooner than 2026)

## 2024-09-29 NOTE — Progress Notes (Signed)
 "  Office Visit Note  Patient: Leslie Brewer             Date of Birth: 1960-10-21           MRN: [id number]             PCP: Maree Leni Edyth DELENA, MD Referring: Maree Leni Edyth DELENA, MD Visit Date: 10/13/2024 Occupation: Data Unavailable  Subjective:  Neck pain   History of Present Illness: Shetara Launer is a 64 y.o. female with history of seronegative rheumatoid arthritis and osteoarthritis.  Patient remains on enbrel  50 mg sq injections once weekly.  She is tolerating Enbrel  without any side effects and has not had any gaps in therapy.  She denies any signs or symptoms of a rheumatoid arthritis flare.  Patient presents today with ongoing right upper extremity numbness and tingling.  Patient states that the symptoms have been constant.  Patient has been experiencing interrupted sleep at night due to the pins and needle sensation in the right arm extending to the right hand.  Patient states that she was prescribed Robaxin by her PCP in November 2025 which provided temporary relief.  She denies any other joint pain or joint swelling at this time. She denies any gaps in therapy.  Denies any recent or recurrent infections.   Activities of Daily Living:  Patient reports morning stiffness for 5-10 minutes.   Patient Reports nocturnal pain.  Difficulty dressing/grooming: Reports Difficulty climbing stairs: Reports Difficulty getting out of chair: Denies Difficulty using hands for taps, buttons, cutlery, and/or writing: Reports  Review of Systems  Constitutional:  Positive for fatigue.  HENT:  Positive for mouth dryness. Negative for mouth sores.   Eyes:  Positive for dryness.  Respiratory:  Positive for shortness of breath.   Cardiovascular:  Positive for palpitations. Negative for chest pain.  Gastrointestinal:  Positive for constipation. Negative for blood in stool and diarrhea.  Endocrine: Negative for increased urination.  Genitourinary:  Negative for involuntary urination.   Musculoskeletal:  Positive for joint pain, gait problem, joint pain, joint swelling, myalgias, morning stiffness, muscle tenderness and myalgias. Negative for muscle weakness.  Skin:  Positive for sensitivity to sunlight. Negative for color change, rash and hair loss.  Allergic/Immunologic: Negative for susceptible to infections.  Neurological:  Positive for dizziness. Negative for headaches.  Hematological:  Negative for swollen glands.  Psychiatric/Behavioral:  Positive for sleep disturbance. Negative for depressed mood. The patient is not nervous/anxious.     PMFS History:  Patient Active Problem List   Diagnosis Date Noted   Rheumatoid arthritis involving multiple sites with positive rheumatoid factor (HCC) 02/23/2021   H/O bilateral hip replacements 02/23/2021   History of total left knee replacement 02/23/2021    Past Medical History:  Diagnosis Date   Guillain Barr syndrome    History of Graves' disease    Hyperlipidemia    Rheumatoid arthritis (HCC)     Family History  Problem Relation Age of Onset   Cancer Mother    Healthy Daughter    Past Surgical History:  Procedure Laterality Date   APPENDECTOMY     CATARACT EXTRACTION Bilateral 2024   REPLACEMENT TOTAL KNEE Left 2020   TOTAL HIP ARTHROPLASTY Right    TOTAL HIP ARTHROPLASTY Left    TUBAL LIGATION     Social History[1] Social History   Social History Narrative   Not on file     Immunization History  Administered Date(s) Administered   Moderna Sars-Covid-2 Vaccination 01/01/2020, 01/29/2020,  08/29/2020, 06/13/2021   Pfizer(Comirnaty)Fall Seasonal Vaccine 12 years and older 07/10/2024     Objective: Vital Signs: BP 127/77   Pulse 77   Temp 97.6 F (36.4 C)   Resp 14   Ht 5' 7 (1.702 m)   Wt 171 lb (77.6 kg)   BMI 26.78 kg/m    Physical Exam Vitals and nursing note reviewed.  Constitutional:      Appearance: She is well-developed.  HENT:     Head: Normocephalic and atraumatic.  Eyes:      Conjunctiva/sclera: Conjunctivae normal.  Cardiovascular:     Rate and Rhythm: Normal rate and regular rhythm.     Heart sounds: Normal heart sounds.  Pulmonary:     Effort: Pulmonary effort is normal.     Breath sounds: Normal breath sounds.  Abdominal:     General: Bowel sounds are normal.     Palpations: Abdomen is soft.  Musculoskeletal:     Cervical back: Normal range of motion.  Lymphadenopathy:     Cervical: No cervical adenopathy.  Skin:    General: Skin is warm and dry.     Capillary Refill: Capillary refill takes less than 2 seconds.  Neurological:     Mental Status: She is alert and oriented to person, place, and time.  Psychiatric:        Behavior: Behavior normal.      Musculoskeletal Exam: C-spine has slightly limited range of motion with lateral rotation.  Thoracic spine and  lumbar spine have good range of motion.  No midline spinal tenderness.  No SI joint tenderness.  Shoulder joints, elbow joints, wrist joints, MCPs, PIPs, DIPs have good range of motion with no synovitis.  Decreased grip strength of the right hand noted.  Complete fist formation bilaterally.  Hip joints have good range of motion with no groin pain.  Knee joints have good range of motion no warmth or effusion.  Ankle joints have good range of motion no tenderness or joint swelling.  No evidence of Achilles tendinitis or plantar fasciitis.   CDAI Exam: CDAI Score: -- Patient Global: --; Provider Global: -- Swollen: --; Tender: -- Joint Exam 10/13/2024   No joint exam has been documented for this visit   There is currently no information documented on the homunculus. Go to the Rheumatology activity and complete the homunculus joint exam.  Investigation: No additional findings.  Imaging: No results found.  Recent Labs: Lab Results  Component Value Date   WBC 8.2 07/10/2024   HGB 12.9 07/10/2024   PLT 211 07/10/2024   NA 141 07/10/2024   K 3.8 07/10/2024   CL 105 07/10/2024   CO2 28  07/10/2024   GLUCOSE 79 07/10/2024   BUN 9 07/10/2024   CREATININE 0.73 07/10/2024   BILITOT 0.7 07/10/2024   ALKPHOS 72 06/27/2022   AST 20 07/10/2024   ALT 25 07/10/2024   PROT 6.8 07/10/2024   ALBUMIN 4.4 06/27/2022   CALCIUM 9.7 07/10/2024   GFRAA 101 02/23/2021   QFTBGOLDPLUS NEGATIVE 01/08/2024    Speciality Comments: dxd in [place] 17 years ago.  Treated with prednisone many years, Enbrel  for 6 years off and on  Procedures:  No procedures performed Allergies: Latex   Assessment / Plan:     Visit Diagnoses: Rheumatoid arthritis of multiple sites with negative rheumatoid factor (HCC) - RF negative, anti-CCP negative, positive ANA.  Intermittent treatment with Enbrel  since 2019: She has no synovitis on examination today.  She has not had any signs  or symptoms of a rheumatoid arthritis flare.  She has clinically been doing well on Enbrel  50 mg sq injections once weekly.  She is tolerating Enbrel  without any side effects or injection site reactions.  She has not had any recent gaps in therapy.  No medication changes will be made at this time.  She was advised to notify us  if she develops any signs or symptoms of a flare.  She will follow-up in the office in 5 months or sooner if needed.  High risk medication use - Enbrel  50 mg sq injections once weekly. CBC and CMP updated on 07/10/24. Orders for CBC and CMP released today.  TB gold negative on 01/08/24. Lipid panel updated on 07/10/24.  Discussed the importance of holding enbrel  if she develops signs or symptoms of an infection and to resume once the infection has completely cleared.    - Plan: CBC with Differential/Platelet, Comprehensive metabolic panel with GFR  Osteopenia of left forearm - 07/31/2022:The BMD measured at Forearm Radius 33% is 0.688 g/cm2 with a T-score of -2.1.  She is taking a calcium supplement and vitamin D 2000 units daily. DEXA order remains in place.  Primary osteoarthritis of both hands: She has PIP and DIP  thickening consistent with osteoarthritis of both hands. No synovitis noted.    H/O bilateral hip replacements: No groin pain currently.  Using a cane to assist with ambulation.  History of total left knee replacement: Doing well.  No effusion noted.  Using a cane to assist with ambulation.  Primary osteoarthritis of right knee: No warmth or effusion noted.  Intermittent discomfort in the right knee.  Using cane to assist with ambulation.  Primary osteoarthritis of both feet: She has good ROM of both ankle joints with no tenderness or joint swelling.  She wearing proper fitting shoes.   Neck pain -Patient presents today with right upper extremity pain--numbness and tingling.  The numbness and tingling have been primarily affecting the right 4th and 5th digits.  She has been experiencing interrupted sleep at night due to nocturnal symptoms.  She tried taking methocarbamol November which allowed for better sleep at night some temporary relief but her symptoms have continued.  On examination the cervical spine has slightly limited range of motion with lateral rotation.  Good flexion and extension.  Decreased grip strength noted on the right side.  X-rays of the cervical spine were obtained today for further evaluation.   Plan to refer the patient to a spine specialist due to the sensorimotor symptoms she is experiencing for further evaluation.  Discussed that she may require physical therapy but will likely need an MRI for further evaluation.  Plan: XR Cervical Spine 2 or 3 views  Other medical conditions are listed as follows:  Dyslipidemia: Lipid panel within normal limits on 07/10/2024.  Graves' ophthalmopathy  H/O Guillain-Barre syndrome  History of COPD  Smoker    Orders: Orders Placed This Encounter  Procedures   XR Cervical Spine 2 or 3 views   CBC with Differential/Platelet   Comprehensive metabolic panel with GFR   No orders of the defined types were placed in this  encounter.    Follow-Up Instructions: Return in about 3 months (around 01/11/2025) for Rheumatoid arthritis.   Waddell CHRISTELLA Craze, PA-C  Note - This record has been created using Dragon software.  Chart creation errors have been sought, but may not always  have been located. Such creation errors do not reflect on  the standard of medical care.     [  1]  Social History Tobacco Use   Smoking status: Former    Current packs/day: 1.00    Average packs/day: 1 pack/day for 30.0 years (30.0 ttl pk-yrs)    Types: Cigarettes    Passive exposure: Past   Smokeless tobacco: Never  Vaping Use   Vaping status: Former  Substance Use Topics   Alcohol use: Not Currently    Comment: occ   Drug use: Not Currently   "

## 2024-10-13 ENCOUNTER — Encounter: Payer: Self-pay | Admitting: Physician Assistant

## 2024-10-13 ENCOUNTER — Ambulatory Visit

## 2024-10-13 ENCOUNTER — Other Ambulatory Visit: Payer: Self-pay

## 2024-10-13 ENCOUNTER — Other Ambulatory Visit: Payer: Self-pay | Admitting: *Deleted

## 2024-10-13 ENCOUNTER — Ambulatory Visit: Payer: Self-pay | Admitting: Physician Assistant

## 2024-10-13 ENCOUNTER — Ambulatory Visit: Attending: Physician Assistant | Admitting: Physician Assistant

## 2024-10-13 VITALS — BP 127/77 | HR 77 | Temp 97.6°F | Resp 14 | Ht 67.0 in | Wt 171.0 lb

## 2024-10-13 DIAGNOSIS — Z96652 Presence of left artificial knee joint: Secondary | ICD-10-CM

## 2024-10-13 DIAGNOSIS — M542 Cervicalgia: Secondary | ICD-10-CM

## 2024-10-13 DIAGNOSIS — Z8709 Personal history of other diseases of the respiratory system: Secondary | ICD-10-CM | POA: Diagnosis not present

## 2024-10-13 DIAGNOSIS — F17200 Nicotine dependence, unspecified, uncomplicated: Secondary | ICD-10-CM

## 2024-10-13 DIAGNOSIS — E0500 Thyrotoxicosis with diffuse goiter without thyrotoxic crisis or storm: Secondary | ICD-10-CM

## 2024-10-13 DIAGNOSIS — Z8669 Personal history of other diseases of the nervous system and sense organs: Secondary | ICD-10-CM | POA: Diagnosis not present

## 2024-10-13 DIAGNOSIS — M19072 Primary osteoarthritis, left ankle and foot: Secondary | ICD-10-CM

## 2024-10-13 DIAGNOSIS — H05839 Thyroid orbitopathy, unspecified orbit: Secondary | ICD-10-CM

## 2024-10-13 DIAGNOSIS — E785 Hyperlipidemia, unspecified: Secondary | ICD-10-CM | POA: Diagnosis not present

## 2024-10-13 DIAGNOSIS — M85832 Other specified disorders of bone density and structure, left forearm: Secondary | ICD-10-CM | POA: Diagnosis not present

## 2024-10-13 DIAGNOSIS — M0609 Rheumatoid arthritis without rheumatoid factor, multiple sites: Secondary | ICD-10-CM

## 2024-10-13 DIAGNOSIS — M19041 Primary osteoarthritis, right hand: Secondary | ICD-10-CM

## 2024-10-13 DIAGNOSIS — M1711 Unilateral primary osteoarthritis, right knee: Secondary | ICD-10-CM

## 2024-10-13 DIAGNOSIS — M19071 Primary osteoarthritis, right ankle and foot: Secondary | ICD-10-CM | POA: Diagnosis not present

## 2024-10-13 DIAGNOSIS — Z96643 Presence of artificial hip joint, bilateral: Secondary | ICD-10-CM | POA: Diagnosis not present

## 2024-10-13 DIAGNOSIS — Z79899 Other long term (current) drug therapy: Secondary | ICD-10-CM | POA: Diagnosis not present

## 2024-10-13 DIAGNOSIS — M19042 Primary osteoarthritis, left hand: Secondary | ICD-10-CM

## 2024-10-13 MED ORDER — ENBREL SURECLICK 50 MG/ML ~~LOC~~ SOAJ: 50.0000 mg | SUBCUTANEOUS | 0 refills | Status: AC

## 2024-10-13 NOTE — Progress Notes (Unsigned)
 Patient was seen in office today, per Waddell Craze, PA-C, refill enbrel . Please review and sign.

## 2024-10-13 NOTE — Progress Notes (Signed)
 X-rays of the cervical spine revealed multilevel spondylosis and spondylolisthesis.  Facet arthropathy was also noted.  Please notify the patient.   Recommend referral to spine specialist due to radiating pain to the right hand and increased muscle weakness.

## 2024-10-14 LAB — COMPREHENSIVE METABOLIC PANEL WITH GFR
AG Ratio: 2 (calc) (ref 1.0–2.5)
ALT: 14 U/L (ref 6–29)
AST: 19 U/L (ref 10–35)
Albumin: 4.5 g/dL (ref 3.6–5.1)
Alkaline phosphatase (APISO): 104 U/L (ref 37–153)
BUN: 9 mg/dL (ref 7–25)
CO2: 30 mmol/L (ref 20–32)
Calcium: 9.7 mg/dL (ref 8.6–10.4)
Chloride: 104 mmol/L (ref 98–110)
Creat: 0.76 mg/dL (ref 0.50–1.05)
Globulin: 2.3 g/dL (ref 1.9–3.7)
Glucose, Bld: 82 mg/dL (ref 65–99)
Potassium: 4.5 mmol/L (ref 3.5–5.3)
Sodium: 140 mmol/L (ref 135–146)
Total Bilirubin: 0.7 mg/dL (ref 0.2–1.2)
Total Protein: 6.8 g/dL (ref 6.1–8.1)
eGFR: 87 mL/min/1.73m2

## 2024-10-14 LAB — CBC WITH DIFFERENTIAL/PLATELET
Absolute Lymphocytes: 3536 {cells}/uL (ref 850–3900)
Absolute Monocytes: 789 {cells}/uL (ref 200–950)
Basophils Absolute: 58 {cells}/uL (ref 0–200)
Basophils Relative: 0.7 %
Eosinophils Absolute: 66 {cells}/uL (ref 15–500)
Eosinophils Relative: 0.8 %
HCT: 41.8 % (ref 35.9–46.0)
Hemoglobin: 13.7 g/dL (ref 11.7–15.5)
MCH: 29.4 pg (ref 27.0–33.0)
MCHC: 32.8 g/dL (ref 31.6–35.4)
MCV: 89.7 fL (ref 81.4–101.7)
MPV: 11.1 fL (ref 7.5–12.5)
Monocytes Relative: 9.5 %
Neutro Abs: 3851 {cells}/uL (ref 1500–7800)
Neutrophils Relative %: 46.4 %
Platelets: 216 Thousand/uL (ref 140–400)
RBC: 4.66 Million/uL (ref 3.80–5.10)
RDW: 13.2 % (ref 11.0–15.0)
Total Lymphocyte: 42.6 %
WBC: 8.3 Thousand/uL (ref 3.8–10.8)

## 2024-10-14 NOTE — Progress Notes (Signed)
 CBC and CMP WNL

## 2024-10-26 NOTE — Telephone Encounter (Signed)
 Per [email or web address], application for Enbrel  is under review.

## 2025-01-12 ENCOUNTER — Ambulatory Visit: Admitting: Physician Assistant
